# Patient Record
Sex: Female | Born: 1990 | Hispanic: Yes | Marital: Single | Smoking: Never smoker
Health system: Southern US, Community
[De-identification: ages and names within clinical notes are randomized; demographics above are authoritative.]

## PROBLEM LIST (undated history)

## (undated) ENCOUNTER — Inpatient Hospital Stay: Payer: Self-pay

## (undated) DIAGNOSIS — Z789 Other specified health status: Secondary | ICD-10-CM

## (undated) DIAGNOSIS — G43909 Migraine, unspecified, not intractable, without status migrainosus: Secondary | ICD-10-CM

## (undated) HISTORY — DX: Migraine, unspecified, not intractable, without status migrainosus: G43.909

---
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
## 2011-08-02 ENCOUNTER — Emergency Department: Payer: Self-pay | Admitting: Emergency Medicine

## 2011-08-02 LAB — COMPREHENSIVE METABOLIC PANEL
Albumin: 4.3 g/dL (ref 3.4–5.0)
Alkaline Phosphatase: 79 U/L (ref 50–136)
Anion Gap: 11 (ref 7–16)
BUN: 11 mg/dL (ref 7–18)
Bilirubin,Total: 0.6 mg/dL (ref 0.2–1.0)
Calcium, Total: 8.7 mg/dL (ref 8.5–10.1)
Chloride: 106 mmol/L (ref 98–107)
Co2: 23 mmol/L (ref 21–32)
Creatinine: 0.58 mg/dL — ABNORMAL LOW (ref 0.60–1.30)
EGFR (African American): >60
EGFR (Non-African Amer.): >60
Glucose: 92 mg/dL (ref 65–99)
Osmolality: 278 (ref 275–301)
Potassium: 3.8 mmol/L (ref 3.5–5.1)
SGOT(AST): 17 U/L (ref 15–37)
SGPT (ALT): 20 U/L
Sodium: 140 mmol/L (ref 136–145)
Total Protein: 8.2 g/dL (ref 6.4–8.2)

## 2011-08-02 LAB — CBC
HCT: 39 % (ref 35.0–47.0)
HGB: 12.8 g/dL (ref 12.0–16.0)
MCH: 30.6 pg (ref 26.0–34.0)
MCHC: 32.9 g/dL (ref 32.0–36.0)
MCV: 93 fL (ref 80–100)
Platelet: 266 10*3/uL (ref 150–440)
RBC: 4.2 10*6/uL (ref 3.80–5.20)
RDW: 13.2 % (ref 11.5–14.5)
WBC: 8.6 10*3/uL (ref 3.6–11.0)

## 2011-08-02 LAB — HCG, QUANTITATIVE, PREGNANCY: Beta Hcg, Quant.: 40 m[IU]/mL — ABNORMAL HIGH

## 2011-08-03 LAB — URINALYSIS, COMPLETE
Bacteria: NONE SEEN
Bilirubin,UR: NEGATIVE
Glucose,UR: NEGATIVE mg/dL (ref 0–75)
Ketone: NEGATIVE
Leukocyte Esterase: NEGATIVE
Nitrite: NEGATIVE
Ph: 7 (ref 4.5–8.0)
Protein: NEGATIVE
RBC,UR: <1 /HPF (ref 0–5)
Specific Gravity: 1 (ref 1.003–1.030)
Squamous Epithelial: 1
WBC UR: <1 /HPF (ref 0–5)

## 2011-08-03 LAB — WET PREP, GENITAL

## 2012-11-09 ENCOUNTER — Ambulatory Visit: Payer: Self-pay | Admitting: Advanced Practice Midwife

## 2013-02-17 ENCOUNTER — Inpatient Hospital Stay: Payer: Self-pay | Admitting: Obstetrics and Gynecology

## 2013-02-17 LAB — CBC WITH DIFFERENTIAL/PLATELET
Basophil #: 0.1 10*3/uL (ref 0.0–0.1)
Basophil %: 0.4 %
Eosinophil #: 0 10*3/uL (ref 0.0–0.7)
Eosinophil %: 0 %
HCT: 38.4 % (ref 35.0–47.0)
HGB: 12.6 g/dL (ref 12.0–16.0)
Lymphocyte #: 1.6 10*3/uL (ref 1.0–3.6)
Lymphocyte %: 12.3 %
MCH: 28.6 pg (ref 26.0–34.0)
MCHC: 32.7 g/dL (ref 32.0–36.0)
MCV: 88 fL (ref 80–100)
Monocyte #: 0.6 x10 3/mm (ref 0.2–0.9)
Monocyte %: 4.5 %
Neutrophil #: 10.6 10*3/uL — ABNORMAL HIGH (ref 1.4–6.5)
Neutrophil %: 82.8 %
Platelet: 219 10*3/uL (ref 150–440)
RBC: 4.38 10*6/uL (ref 3.80–5.20)
RDW: 13.9 % (ref 11.5–14.5)
WBC: 12.8 10*3/uL — ABNORMAL HIGH (ref 3.6–11.0)

## 2013-02-18 LAB — GC/CHLAMYDIA PROBE AMP

## 2013-02-18 LAB — HEMOGLOBIN: HGB: 11.7 g/dL — ABNORMAL LOW (ref 12.0–16.0)

## 2014-02-28 NOTE — L&D Delivery Note (Signed)
Delivery Note At 1:38 PM a viable female  was delivered  At 1338 via Vaginal, Spontaneous Delivery (Presentation: LOA ;  ).  APGAR:8/9  , ; weight  .8/10 #   Placenta status: , .  Cord:3 v  with the following complications:Mild shoulder dystocia . Mcroberts and suprapubic pressure allowed for delivery of anterior shoulder . Estimated time on the perineum 15 seconds . Anesthesia:  None   Episiotomy:  None  Lacerations: none   Suture Repair: none  Est. Blood Loss (mL):250 cc    Mom to postpartum.  Baby to Couplet care / Skin to Skin.  SCHERMERHORN,THOMAS 02/12/2015, 1:47 PM

## 2014-07-08 NOTE — H&P (Signed)
L&D Evaluation:  History:  HPI P1 at 38 weeks   Presents with contractions   Patient's Medical History No Chronic Illness  Hypertension   Patient's Surgical History none   Medications Pre Natal Vitamins   Allergies NKDA   Social History none   Family History Non-Contributory   ROS:  ROS All systems were reviewed.  HEENT, CNS, GI, GU, Respiratory, CV, Renal and Musculoskeletal systems were found to be normal.   Exam:  Vital Signs stable   Mental Status clear   FHT normal rate with no decels   Ucx regular   Ucx Frequency 3 min   Impression:  Impression active labor   Plan:  Comments antic svd   Electronic Signatures: Margaretha GlassingEvans, Ricky L (MD)  (Signed 21-Dec-14 17:08)  Authored: L&D Evaluation   Last Updated: 21-Dec-14 17:08 by Margaretha GlassingEvans, Ricky L (MD)

## 2014-08-13 ENCOUNTER — Other Ambulatory Visit: Payer: Self-pay | Admitting: Advanced Practice Midwife

## 2014-08-13 DIAGNOSIS — O3680X0 Pregnancy with inconclusive fetal viability, not applicable or unspecified: Secondary | ICD-10-CM

## 2014-08-13 LAB — OB RESULTS CONSOLE HIV ANTIBODY (ROUTINE TESTING): HIV: NONREACTIVE

## 2014-08-14 ENCOUNTER — Other Ambulatory Visit: Payer: Self-pay | Admitting: Advanced Practice Midwife

## 2014-08-14 ENCOUNTER — Ambulatory Visit
Admission: RE | Admit: 2014-08-14 | Discharge: 2014-08-14 | Disposition: A | Discharge status: Home / Self Care | Payer: Medicaid Other | Source: Ambulatory Visit | Attending: Advanced Practice Midwife | Admitting: Advanced Practice Midwife

## 2014-08-14 DIAGNOSIS — Z36 Encounter for antenatal screening of mother: Secondary | ICD-10-CM | POA: Insufficient documentation

## 2014-08-14 DIAGNOSIS — O3680X0 Pregnancy with inconclusive fetal viability, not applicable or unspecified: Secondary | ICD-10-CM

## 2014-08-14 DIAGNOSIS — Z3A13 13 weeks gestation of pregnancy: Secondary | ICD-10-CM | POA: Insufficient documentation

## 2014-08-14 LAB — OB RESULTS CONSOLE ABO/RH: RH Type: POSITIVE

## 2014-08-14 LAB — OB RESULTS CONSOLE HGB/HCT, BLOOD
HCT: 39 %
Hemoglobin: 14.8 g/dL

## 2014-08-14 LAB — OB RESULTS CONSOLE GC/CHLAMYDIA
Chlamydia: NEGATIVE
Gonorrhea: NEGATIVE

## 2014-08-14 LAB — OB RESULTS CONSOLE HEPATITIS B SURFACE ANTIGEN: Hepatitis B Surface Ag: NEGATIVE

## 2014-09-10 ENCOUNTER — Other Ambulatory Visit: Payer: Self-pay

## 2014-09-10 ENCOUNTER — Other Ambulatory Visit: Payer: Self-pay | Admitting: Advanced Practice Midwife

## 2014-09-10 DIAGNOSIS — Z3492 Encounter for supervision of normal pregnancy, unspecified, second trimester: Secondary | ICD-10-CM

## 2014-09-26 ENCOUNTER — Ambulatory Visit
Admission: RE | Admit: 2014-09-26 | Discharge: 2014-09-26 | Disposition: A | Discharge status: Home / Self Care | Payer: Medicaid Other | Source: Ambulatory Visit | Attending: Advanced Practice Midwife | Admitting: Advanced Practice Midwife

## 2014-09-26 DIAGNOSIS — Z3482 Encounter for supervision of other normal pregnancy, second trimester: Secondary | ICD-10-CM | POA: Diagnosis not present

## 2014-09-26 DIAGNOSIS — Z3492 Encounter for supervision of normal pregnancy, unspecified, second trimester: Secondary | ICD-10-CM

## 2014-10-08 ENCOUNTER — Other Ambulatory Visit: Payer: Self-pay | Admitting: Advanced Practice Midwife

## 2014-10-08 DIAGNOSIS — Z3482 Encounter for supervision of other normal pregnancy, second trimester: Secondary | ICD-10-CM

## 2014-10-15 ENCOUNTER — Ambulatory Visit
Admission: RE | Admit: 2014-10-15 | Discharge: 2014-10-15 | Disposition: A | Discharge status: Home / Self Care | Payer: Managed Care, Other (non HMO) | Source: Ambulatory Visit | Attending: Advanced Practice Midwife | Admitting: Advanced Practice Midwife

## 2014-10-15 DIAGNOSIS — Z3A22 22 weeks gestation of pregnancy: Secondary | ICD-10-CM | POA: Insufficient documentation

## 2014-10-15 DIAGNOSIS — Z3482 Encounter for supervision of other normal pregnancy, second trimester: Secondary | ICD-10-CM | POA: Insufficient documentation

## 2014-11-23 ENCOUNTER — Observation Stay
Admission: EM | Admit: 2014-11-23 | Discharge: 2014-11-23 | Disposition: A | Discharge status: Home / Self Care | Payer: Managed Care, Other (non HMO) | Source: Home / Self Care | Admitting: Obstetrics and Gynecology

## 2014-11-23 ENCOUNTER — Encounter: Payer: Self-pay | Admitting: *Deleted

## 2014-11-23 DIAGNOSIS — Z3A27 27 weeks gestation of pregnancy: Secondary | ICD-10-CM | POA: Insufficient documentation

## 2014-11-23 DIAGNOSIS — B9689 Other specified bacterial agents as the cause of diseases classified elsewhere: Secondary | ICD-10-CM | POA: Diagnosis present

## 2014-11-23 DIAGNOSIS — R0981 Nasal congestion: Secondary | ICD-10-CM | POA: Diagnosis present

## 2014-11-23 DIAGNOSIS — R1011 Right upper quadrant pain: Secondary | ICD-10-CM

## 2014-11-23 DIAGNOSIS — J069 Acute upper respiratory infection, unspecified: Secondary | ICD-10-CM | POA: Diagnosis present

## 2014-11-23 DIAGNOSIS — N760 Acute vaginitis: Secondary | ICD-10-CM

## 2014-11-23 DIAGNOSIS — O26899 Other specified pregnancy related conditions, unspecified trimester: Secondary | ICD-10-CM

## 2014-11-23 DIAGNOSIS — O26892 Other specified pregnancy related conditions, second trimester: Secondary | ICD-10-CM | POA: Insufficient documentation

## 2014-11-23 HISTORY — DX: Other specified health status: Z78.9

## 2014-11-23 LAB — URINALYSIS COMPLETE WITH MICROSCOPIC (ARMC ONLY)
Bilirubin Urine: NEGATIVE
Glucose, UA: NEGATIVE mg/dL
Leukocytes, UA: NEGATIVE
Nitrite: NEGATIVE
Protein, ur: NEGATIVE mg/dL
Specific Gravity, Urine: 1.014 (ref 1.005–1.030)
pH: 6 (ref 5.0–8.0)

## 2014-11-23 LAB — CBC WITH DIFFERENTIAL/PLATELET
Basophils Absolute: 0 10*3/uL (ref 0–0.1)
Basophils Relative: 0 %
Eosinophils Absolute: 0 10*3/uL (ref 0–0.7)
Eosinophils Relative: 0 %
HCT: 37.7 % (ref 35.0–47.0)
Hemoglobin: 12.6 g/dL (ref 12.0–16.0)
Lymphocytes Relative: 10 %
Lymphs Abs: 1 10*3/uL (ref 1.0–3.6)
MCH: 30.1 pg (ref 26.0–34.0)
MCHC: 33.4 g/dL (ref 32.0–36.0)
MCV: 90.1 fL (ref 80.0–100.0)
Monocytes Absolute: 1 10*3/uL — ABNORMAL HIGH (ref 0.2–0.9)
Monocytes Relative: 10 %
Neutro Abs: 8.1 10*3/uL — ABNORMAL HIGH (ref 1.4–6.5)
Neutrophils Relative %: 80 %
Platelets: 191 10*3/uL (ref 150–440)
RBC: 4.18 MIL/uL (ref 3.80–5.20)
RDW: 13.6 % (ref 11.5–14.5)
WBC: 10.2 10*3/uL (ref 3.6–11.0)

## 2014-11-23 LAB — COMPREHENSIVE METABOLIC PANEL
ALT: 19 U/L (ref 14–54)
AST: 21 U/L (ref 15–41)
Albumin: 2.9 g/dL — ABNORMAL LOW (ref 3.5–5.0)
Alkaline Phosphatase: 81 U/L (ref 38–126)
Anion gap: 7 (ref 5–15)
BUN: 8 mg/dL (ref 6–20)
CO2: 23 mmol/L (ref 22–32)
Calcium: 8.3 mg/dL — ABNORMAL LOW (ref 8.9–10.3)
Chloride: 102 mmol/L (ref 101–111)
Creatinine, Ser: 0.55 mg/dL (ref 0.44–1.00)
GFR calc Af Amer: >60 mL/min (ref >60)
GFR calc non Af Amer: >60 mL/min (ref >60)
Glucose, Bld: 91 mg/dL (ref 65–99)
Potassium: 3.3 mmol/L — ABNORMAL LOW (ref 3.5–5.1)
Sodium: 132 mmol/L — ABNORMAL LOW (ref 135–145)
Total Bilirubin: 1 mg/dL (ref 0.3–1.2)
Total Protein: 6.7 g/dL (ref 6.5–8.1)

## 2014-11-23 LAB — RAPID INFLUENZA A&B ANTIGENS
Influenza A (ARMC): NEGATIVE
Influenza B (ARMC): NEGATIVE

## 2014-11-23 LAB — ABO/RH: ABO/RH(D): O POS

## 2014-11-23 LAB — WET PREP, GENITAL
Trich, Wet Prep: NONE SEEN
Yeast Wet Prep HPF POC: NONE SEEN

## 2014-11-23 LAB — CHLAMYDIA/NGC RT PCR (ARMC ONLY)
Chlamydia Tr: NOT DETECTED
N gonorrhoeae: NOT DETECTED

## 2014-11-23 LAB — TYPE AND SCREEN
ABO/RH(D): O POS
Antibody Screen: NEGATIVE

## 2014-11-23 LAB — AMYLASE: Amylase: 37 U/L (ref 28–100)

## 2014-11-23 LAB — LIPASE, BLOOD: Lipase: 14 U/L — ABNORMAL LOW (ref 22–51)

## 2014-11-23 MED ORDER — GUAIFENESIN ER 600 MG PO TB12: 600.0000 mg | ORAL_TABLET | Freq: Two times a day (BID) | ORAL | Status: DC

## 2014-11-23 MED ORDER — AMOXICILLIN-POT CLAVULANATE 500-125 MG PO TABS: 1.0000 | ORAL_TABLET | Freq: Three times a day (TID) | ORAL | Status: DC

## 2014-11-23 MED ORDER — LACTATED RINGERS IV BOLUS (SEPSIS): 800.0000 mL | Freq: Once | INTRAVENOUS | Status: DC

## 2014-11-23 MED ORDER — BENZOCAINE-MENTHOL 15-3.6 MG MT LOZG
1.0000 | LOZENGE | OROMUCOSAL | Status: DC | PRN
  Filled 2014-11-23: qty 1

## 2014-11-23 MED ORDER — DEXTROSE-NACL 5-0.9 % IV SOLN
INTRAVENOUS | Status: DC
  Administered 2014-11-23: 100 mL/h via INTRAVENOUS

## 2014-11-23 MED ORDER — SALINE SPRAY 0.65 % NA SOLN
1.0000 | NASAL | Status: DC | PRN
  Filled 2014-11-23: qty 44

## 2014-11-23 MED ORDER — GUAIFENESIN ER 600 MG PO TB12
600.0000 mg | ORAL_TABLET | Freq: Two times a day (BID) | ORAL | Status: DC
  Administered 2014-11-23: 600 mg via ORAL

## 2014-11-23 MED ORDER — LACTATED RINGERS IV SOLN
INTRAVENOUS | Status: DC
  Administered 2014-11-23: 125 mL/h via INTRAVENOUS

## 2014-11-23 MED ORDER — DOCUSATE SODIUM 100 MG PO CAPS
100.0000 mg | ORAL_CAPSULE | Freq: Every day | ORAL | Status: DC
  Administered 2014-11-23: 100 mg via ORAL
  Filled 2014-11-23: qty 1

## 2014-11-23 MED ORDER — CALCIUM CARBONATE ANTACID 500 MG PO CHEW: 2.0000 | CHEWABLE_TABLET | ORAL | Status: DC | PRN

## 2014-11-23 MED ORDER — PRENATAL MULTIVITAMIN CH
1.0000 | ORAL_TABLET | Freq: Every day | ORAL | Status: DC
  Administered 2014-11-23: 1 via ORAL
  Filled 2014-11-23: qty 1

## 2014-11-23 MED ORDER — AMOXICILLIN-POT CLAVULANATE 500-125 MG PO TABS
1.0000 | ORAL_TABLET | Freq: Three times a day (TID) | ORAL | Status: DC
  Administered 2014-11-23 (×2): 500 mg via ORAL
  Filled 2014-11-23 (×3): qty 1

## 2014-11-23 MED ORDER — METRONIDAZOLE 500 MG PO TABS
500.0000 mg | ORAL_TABLET | Freq: Two times a day (BID) | ORAL | Status: DC
  Administered 2014-11-23: 500 mg via ORAL
  Filled 2014-11-23: qty 1

## 2014-11-23 MED ORDER — METRONIDAZOLE 500 MG PO TABS: 500.0000 mg | ORAL_TABLET | Freq: Two times a day (BID) | ORAL | Status: DC

## 2014-11-23 MED ORDER — MENTHOL 3 MG MT LOZG
1.0000 | LOZENGE | OROMUCOSAL | Status: DC | PRN
  Filled 2014-11-23: qty 9

## 2014-11-23 MED ORDER — ACETAMINOPHEN 325 MG PO TABS: 650.0000 mg | ORAL_TABLET | ORAL | Status: DC | PRN

## 2014-11-23 MED ORDER — ACETAMINOPHEN 325 MG PO TABS
650.0000 mg | ORAL_TABLET | ORAL | Status: DC | PRN
  Administered 2014-11-23 (×3): 650 mg via ORAL
  Filled 2014-11-23 (×2): qty 2

## 2014-11-23 MED ORDER — ZOLPIDEM TARTRATE 5 MG PO TABS: 5.0000 mg | ORAL_TABLET | Freq: Every evening | ORAL | Status: DC | PRN

## 2014-11-23 MED ORDER — SALINE SPRAY 0.65 % NA SOLN
1.0000 | NASAL | Status: DC | PRN
  Filled 2014-11-23: qty 45

## 2014-11-23 NOTE — OB Triage Note (Addendum)
Pt reports symptoms listed above. Resting quietly at present, adjusting position prn, does not appear in distress from pain at present. Coughing intermittently, no production viewed. Lights dimmed per pt preference. Fetal movement heard audible, pt unsure she feels any fetal movement.

## 2014-11-23 NOTE — Discharge Instructions (Signed)
Infeccin de las vas areas superiores en los adultos (Upper Respiratory Infection, Adult)  La infeccin respiratoria de las vas areas superiores se conoce tambin como resfro comn. Las vas areas superiores incluyen los senos nasales, la garganta, la trquea, y los bronquios. Los bronquios son las vas areas que conducen el aire a los pulmonBaxter Internationalyor parte de las personas mejora luego de una [place], Biomedical engineer los sntomas pueden durar [employer]. La tos residual puede durar ms. CAUSAS Varios tipos de virus pueden causar la infeccin de los tejidos que cubren las vas areas superiores. Los tejidos se irritan y se inflaman y se originan secreciones. Tambin es frecuente la produccin de moco. El resfro es contagioso. El virus se disemina fcilmente a otras personas por contacto oral. Aqu se incluyen los besos, el compartir un vaso y el toser o Engineering geologist. Tambin puede diseminarse tocndose la boca o la [place] y luego tocando una superficie que luego tocan Economist.  SNTOMAS Los sntomas se desarrollan entre uno y [place] luego de Cytogeneticist en contacto con el virus. Pueden variar de Neomia Dear persona a otra. Incluyen:  Secrecin nasal.  Estornudos  Congestin nasal.  Irritacin de los senos nasales.  Dolor de Advertising copywriter.  Prdida de la voz (laringitis).  Tos.  Fatiga.  Dolores musculares.  Prdida del apetito.  Dolor de [place].  Fiebre no muy elevada. DIAGNSTICO Puede diagnosticarse a s mismo la infeccin respiratoria, segn los sntomas habituales, ya que la mayor parte de las personas se resfra dos o tres veces al ao. El profesional puede confirmarlo basndose en el examen fsico. Lo ms importante es que el profesional verifique que los sntomas no se deben a otra enfermedad como anginas, sinusitis, neumona, asma o epiglotitis. Para diagnosticar el resfrio comn, no es necesario que haga anlisis de [place], pruebas en la garganta o radiografas, pero en algunos  casos puede ser de utilidad para excluir otros problemas ms graves. El mdico decidir si necesita otras pruebas. RIESGOS Y COMPLICACIONES Tendr mayor riesgo de sufrir un resfro grave si consume cigarrillos, sufre una enfermedad cardaca (como insuficiencia cardaca) o pulmonar crnica (como asma) o si tiene un debilitamiento del sistema inmunolgico. Las personas muy jvenes o muy mayores tienen riesgo de sufrir infecciones ms graves. La sinusitis bacteriana, las infecciones del odo medio y la neumona bacteriana pueden complicar el resfro comn. El resfro puede exacerbar el asma y la enfermedad pulmonar obstructiva crnica. En algunos casos estas complicaciones requieren la atencin en un servicio de emergencias y pueden poner en peligro la vida. PREVENCIN La mejor manera de protegerse para no contraer un resfro es Pharmacologist una buena higiene. Evite el contacto bucal o de las manos con personas con sntomas de resfro. Si se produce el contacto, lvese las manos con frecuencia. No hay pruebas firmes que indiquen que la vitamina C, la vitamina E, la equincea o la actividad fsica reduzcan las posibilidades de tener una infeccin. Sin embargo, siempre se recomienda Insurance account manager y Winferd Humphrey buena nutricin. TRATAMIENTO El tratamiento est dirigido a Consulting civil engineer sntomas. Esta enfermedad no tiene [place]. Los antibiticos no son eficaces, ya que esta infeccin la causa un virus y no una bacteria. El tratamiento incluye: 1. Aumente la ingesta de lquidos. Consumo de bebidas deportivas, que proporcionan electrolitos,azcares e hidratacin. 2. Inhale vapor caliente (de un vaporizador o de la ducha). 3. Tomar sopa de pollo u otros lquidos claros, y [employer] buena nutricin. 4. Descanse lo suficiente. 5. Haga grgaras o coma pastillas para Paramedic  las molestias. 6. Control de la fiebre con ibuprofeno o acetaminofen, segn las indicaciones del mdico. 7. Aumento del uso del inhalador, si sufre  asma. Las pastillas y los geles de zinc durante las primeras 24 horas de iniciado el resfro comn, pueden disminuir la duracin y Paramedic la gravedad de los sntomas. Los medicamentos para Chief Technology Officer pueden disminuir la fiebre, Paramedic los dolores musculares y Chief Technology Officer de Advertising copywriter. Se dispone de una gran variedad de medicamentos de venta libre para tratar la congestin y la secrecin nasal. El profesional podr recomendarle inhalantes para los otros sntomas. INSTRUCCIONES PARA EL CUIDADO DOMICILIARIO  Utilice los medicamentos de venta libre o de prescripcin para Chief Technology Officer, el malestar o la [employer]ent, segn se lo indique el profesional que lo asiste.  Utilice un vaporizador caliente o inhale vapor, haciendo salir agua de la ducha para aumentar la humedad [place]. Esto mantendr las secreciones hmedas y Community education officer ms fcil respirar.  Beba gran cantidad de lquido para mantener la orina de tono claro o color amarillo plido.  Descanse todo lo que pueda.  Regrese a su trabajo cuando la temperatura se haya normalizado, o cuando el profesional que lo asiste se lo indique. Quizs sea necesario que permanezca en su casa durante un tiempo ms prolongado para Buyer, retail a Economist. Tambin puede utilizar un barbijo y ser cuidadoso con el lavado de manos para evitar la diseminacin del virus. SOLICITE ATENCIN MDICA SI:  Luego de los primeros das siente que empeora en vez de [place].  Necesita que Occupational psychologist brinde ms informacin relacionada con los medicamentos para [employer] sntomas.  Siente escalofros, le falta el aire o escupe moco de color marrn o rojo. Estos pueden ser sntomas de neumona.  Tiene una secrecin nasal de color amarillo o marrn, o siente dolor en el rostro, especialmente cuando se inclina hacia adelante. Estos pueden ser sntomas de sinusitis.  Tiene fiebre, siente el cuello hinchado, tiene dolor al tragar u observa manchas blancas en el fondo de la  garganta. Estos pueden ser sntomas de angina por estreptococo. Romona Curls ATENCIN MDICA DE INMEDIATO SI:  Lance Muss.  Comienza a sentir Herbalist de cabeza intenso o persistente, dolor de odos, en el seno nasal o en el pecho.  Tiene tos y esta se prolonga demasiado, tose y escupe sangre, la mucosidad habitual se modifica (si tiene una enfermedad pulmonar crnica) o respira con dificultad.  Siente rigidez en el cuello o dolor de cabeza intenso. Document Released: 11/24/2004 Document Revised: 05/09/2011 [facility] Patient Information 2015 [place], [place]. This information is not intended to replace advice given to you by your health care provider. Make sure you discuss any questions you have with your health care provider.  Evaluacin de los movimientos fetales  (Fetal Movement Counts) Nombre del paciente: __________________________________________________ Micheline Chapman estimada: ____________________ Caroleen Hamman de los movimientos fetales es muy recomendable en los embarazos de alto riesgo, pero tambin es una buena idea que lo hagan todas las [place]. El Firefighter que comience a contarlos a las 28 semanas de [place]. Los movimientos fetales suelen aumentar:   Despus de Animator.  Despus de la actividad fsica.  Despus de comer o beber [employer] o fro.  En reposo. Preste atencin cuando sienta que el beb est ms activo. Esto le ayudar a notar un patrn de ciclos de vigilia y sueo de su beb y cules son los factores que contribuyen a un aumento de los movimientos fetales. Es importante  llevar a cabo un recuento de movimientos fetales, al mismo tiempo cada da, cuando el beb normalmente est ms activo.  CMO CONTAR LOS MOVIMIENTOS FETALES 8. Busque un lugar tranquilo y cmodo para sentarse o recostarse sobre el lado izquierdo. Al recostarse sobre su lado izquierdo, le proporciona una mejor circulacin de [place] y oxgeno al beb. 9. Anote el da y la  hora en una hoja de papel o en un diario. 10. Comience contando las pataditas, revoloteos, chasquidos, vueltas o pinchazos en un perodo de 2 horas. Debe sentir al menos 10 movimientos en 2 horas. 11. Si no siente 10 movimientos en 2 horas, espere 2  3 horas y cuente de nuevo. Busque cambios en el patrn o si no cuenta lo suficiente en 2 horas. SOLICITE ATENCIN MDICA SI:   Siente menos de 10 pataditas en 2 horas, en dos intentos.  No hay movimientos durante una hora.  El patrn se modifica o le lleva ms tiempo Art gallery manager las 10 pataditas.  Siente que el beb no se mueve como lo hace habitualmente. Fecha: ____________ Movimientos: ____________ Stevan Born inicio: ____________ Stevan Born finalizacin: ____________  Franco Nones: ____________ Movimientos: ____________ Stevan Born inicio: ____________ Stevan Born finalizacin: ____________  Franco Nones: ____________ Movimientos: ____________ Stevan Born inicio: ____________ Stevan Born finalizacin: ____________  Franco Nones: ____________ Movimientos: ____________ Stevan Born inicio: ____________ Stevan Born finalizacin: ____________  Franco Nones: ____________ Movimientos: ____________ Stevan Born inicio: ____________ Mammie Russian de finalizacin: ____________  Franco Nones: ____________ Movimientos: ____________ Mammie Russian de inicio: ____________ Mammie Russian de finalizacin: ____________  Franco Nones: ____________ Movimientos: ____________ Mammie Russian de inicio: ____________ Mammie Russian de finalizacin: ____________  Franco Nones: ____________ Movimientos: ____________ Mammie Russian de inicio: ____________ Mammie Russian de finalizacin: ____________  Franco Nones: ____________ Movimientos: ____________ Mammie Russian de inicio: ____________ Mammie Russian de finalizacin: ____________  Franco Nones: ____________ Movimientos: ____________ Mammie Russian de inicio: ____________ Mammie Russian de finalizacin: ____________  Franco Nones: ____________ Movimientos: ____________ Mammie Russian de inicio: ____________ Mammie Russian de finalizacin: ____________  Franco Nones: ____________ Movimientos: ____________ Mammie Russian de inicio: ____________ Mammie Russian de  finalizacin: ____________  Franco Nones: ____________ Movimientos: ____________ Mammie Russian de inicio: ____________ Mammie Russian de finalizacin: ____________  Franco Nones: ____________ Movimientos: ____________ Mammie Russian de inicio: ____________ Mammie Russian de finalizacin: ____________  Franco Nones: ____________ Movimientos: ____________ Mammie Russian de inicio: ____________ Mammie Russian de finalizacin: ____________  Franco Nones: ____________ Movimientos: ____________ Mammie Russian de inicio: ____________ Mammie Russian de finalizacin: ____________  Franco Nones: ____________ Movimientos: ____________ Mammie Russian de inicio: ____________ Mammie Russian de finalizacin: ____________  Franco Nones: ____________ Movimientos: ____________ Mammie Russian de inicio: ____________ Mammie Russian de finalizacin: ____________  Franco Nones: ____________ Movimientos: ____________ Mammie Russian de inicio: ____________ Mammie Russian de finalizacin: ____________  Franco Nones: ____________ Movimientos: ____________ Mammie Russian de inicio: ____________ Mammie Russian de finalizacin: ____________  Franco Nones: ____________ Movimientos: ____________ Mammie Russian de inicio: ____________ Mammie Russian de finalizacin: ____________  Franco Nones: ____________ Movimientos: ____________ Mammie Russian de inicio: ____________ Mammie Russian de finalizacin: ____________  Franco Nones: ____________ Movimientos: ____________ Mammie Russian de inicio: ____________ Mammie Russian de finalizacin: ____________  Franco Nones: ____________ Movimientos: ____________ Mammie Russian de inicio: ____________ Mammie Russian de finalizacin: ____________  Franco Nones: ____________ Movimientos: ____________ Mammie Russian de inicio: ____________ Mammie Russian de finalizacin: ____________  Franco Nones: ____________ Movimientos: ____________ Mammie Russian de inicio: ____________ Mammie Russian de finalizacin: ____________  Franco Nones: ____________ Movimientos: ____________ Mammie Russian de inicio: ____________ Mammie Russian de finalizacin: ____________  Franco Nones: ____________ Movimientos: ____________ Mammie Russian de inicio: ____________ Mammie Russian de finalizacin: ____________  Franco Nones: ____________ Movimientos: ____________ Mammie Russian de inicio: ____________ Mammie Russian de finalizacin: ____________  Franco Nones:  ____________ Movimientos: ____________ Mammie Russian de inicio: ____________ Mammie Russian de finalizacin: ____________  Franco Nones: ____________ Movimientos: ____________ Mammie Russian de inicio: ____________ Mammie Russian de finalizacin: ____________  Franco Nones: ____________ Movimientos: ____________ Mammie Russian de inicio: ____________ Stevan Born finalizacin:  ____________  Franco Nones: ____________ Movimientos: ____________ Stevan Born inicio: ____________ Stevan Born finalizacin: ____________  Franco Nones: ____________ Movimientos: ____________ Stevan Born inicio: ____________ Stevan Born finalizacin: ____________  Franco Nones: ____________ Movimientos: ____________ Stevan Born inicio: ____________ Stevan Born finalizacin: ____________  Franco Nones: ____________ Movimientos: ____________ Stevan Born inicio: ____________ Stevan Born finalizacin: ____________  Franco Nones: ____________ Movimientos: ____________ Stevan Born inicio: ____________ Mammie Russian de finalizacin: ____________  Franco Nones: ____________ Movimientos: ____________ Mammie Russian de inicio: ____________ Mammie Russian de finalizacin: ____________  Franco Nones: ____________ Movimientos: ____________ Mammie Russian de inicio: ____________ Mammie Russian de finalizacin: ____________  Franco Nones: ____________ Movimientos: ____________ Mammie Russian de inicio: ____________ Mammie Russian de finalizacin: ____________  Franco Nones: ____________ Movimientos: ____________ Mammie Russian de inicio: ____________ Mammie Russian de finalizacin: ____________  Franco Nones: ____________ Movimientos: ____________ Mammie Russian de inicio: ____________ Mammie Russian de finalizacin: ____________  Franco Nones: ____________ Movimientos: ____________ Mammie Russian de inicio: ____________ Mammie Russian de finalizacin: ____________  Franco Nones: ____________ Movimientos: ____________ Mammie Russian de inicio: ____________ Mammie Russian de finalizacin: ____________  Franco Nones: ____________ Movimientos: ____________ Mammie Russian de inicio: ____________ Mammie Russian de finalizacin: ____________  Franco Nones: ____________ Movimientos: ____________ Mammie Russian de inicio: ____________ Mammie Russian de finalizacin: ____________  Franco Nones: ____________ Movimientos: ____________ Mammie Russian  de inicio: ____________ Mammie Russian de finalizacin: ____________  Franco Nones: ____________ Movimientos: ____________ Mammie Russian de inicio: ____________ Mammie Russian de finalizacin: ____________  Franco Nones: ____________ Movimientos: ____________ Mammie Russian de inicio: ____________ Mammie Russian de finalizacin: ____________  Franco Nones: ____________ Movimientos: ____________ Mammie Russian de inicio: ____________ Mammie Russian de finalizacin: ____________  Franco Nones: ____________ Movimientos: ____________ Mammie Russian de inicio: ____________ Mammie Russian de finalizacin: ____________  Franco Nones: ____________ Movimientos: ____________ Mammie Russian de inicio: ____________ Mammie Russian de finalizacin: ____________  Franco Nones: ____________ Movimientos: ____________ Mammie Russian de inicio: ____________ Mammie Russian de finalizacin: ____________  Franco Nones: ____________ Movimientos: ____________ Mammie Russian de inicio: ____________ Stevan Born finalizacin: ____________  Franco Nones: ____________ Movimientos: ____________ Mammie Russian de inicio: ____________ Mammie Russian de finalizacin: ____________  Franco Nones: ____________ Movimientos: ____________ Stevan Born inicio: ____________ Mammie Russian de finalizacin: ____________  Document Released: 05/24/2007 Document Revised: 02/01/2012 [employer] Patient Information 2015 [place], [place]. This information is not intended to replace advice given to you by your health care provider. Make sure you discuss any questions you have with your health care provider.

## 2014-11-23 NOTE — Progress Notes (Signed)
CNM Sigmon called to floor to notify RN of new orders. CNM given report- states vaginal swabs may wait until pt awake later.

## 2014-11-23 NOTE — Progress Notes (Signed)
Addendum to physical exam: no CVAT   Karena Addison, CNM

## 2014-11-23 NOTE — Progress Notes (Addendum)
ANTEPARTUM NOTE - Hospital Day #1  Subjective: Reports feeling better than yesterday - was able to sleep some overnight Tolerating po intake / no nausea / no vomiting  Voiding QS with no problems or dysuria  Report no vaginal bleeding + Good fetal movement      Still c/o persistent productive cough and intermittent fever Back pain and RUQ pain improving  +white, thick vaginal discharge with odor  Objective: Vital signs: VS: Blood pressure 120/65, pulse 107, temperature 100.1 F (37.8 C), temperature source Oral, resp. rate 20, height 5' (1.524 m), weight 86.183 kg (190 lb), SpO2 97 %.  Labs: Recent Results (from the past 2160 hour(s))  Type and screen     Status: None   Collection Time: 11/23/14  2:55 AM  Result Value Ref Range   ABO/RH(D) O POS    Antibody Screen NEG    Sample Expiration 11/26/2014   Comprehensive metabolic panel     Status: Abnormal   Collection Time: 11/23/14  2:55 AM  Result Value Ref Range   Sodium 132 (L) 135 - 145 mmol/L   Potassium 3.3 (L) 3.5 - 5.1 mmol/L   Chloride 102 101 - 111 mmol/L   CO2 23 22 - 32 mmol/L   Glucose, Bld 91 65 - 99 mg/dL   BUN 8 6 - 20 mg/dL   Creatinine, Ser 0.55 0.44 - 1.00 mg/dL   Calcium 8.3 (L) 8.9 - 10.3 mg/dL   Total Protein 6.7 6.5 - 8.1 g/dL   Albumin 2.9 (L) 3.5 - 5.0 g/dL   AST 21 15 - 41 U/L   ALT 19 14 - 54 U/L   Alkaline Phosphatase 81 38 - 126 U/L   Total Bilirubin 1.0 0.3 - 1.2 mg/dL   GFR calc non Af Amer >60 >60 mL/min   GFR calc Af Amer >60 >60 mL/min    Comment: (NOTE) The eGFR has been calculated using the CKD EPI equation. This calculation has not been validated in all clinical situations. eGFR's persistently <60 mL/min signify possible Chronic Kidney Disease.    Anion gap 7 5 - 15  Lipase, blood     Status: Abnormal   Collection Time: 11/23/14  2:55 AM  Result Value Ref Range   Lipase 14 (L) 22 - 51 U/L  Amylase     Status: None   Collection Time: 11/23/14  2:55 AM  Result Value Ref Range   Amylase 37 28 - 100 U/L  CBC with Differential/Platelet     Status: Abnormal   Collection Time: 11/23/14  2:55 AM  Result Value Ref Range   WBC 10.2 3.6 - 11.0 K/uL   RBC 4.18 3.80 - 5.20 MIL/uL   Hemoglobin 12.6 12.0 - 16.0 g/dL   HCT 37.7 35.0 - 47.0 %   MCV 90.1 80.0 - 100.0 fL   MCH 30.1 26.0 - 34.0 pg   MCHC 33.4 32.0 - 36.0 g/dL   RDW 13.6 11.5 - 14.5 %   Platelets 191 150 - 440 K/uL   Neutrophils Relative % 80 %   Neutro Abs 8.1 (H) 1.4 - 6.5 K/uL   Lymphocytes Relative 10 %   Lymphs Abs 1.0 1.0 - 3.6 K/uL   Monocytes Relative 10 %   Monocytes Absolute 1.0 (H) 0.2 - 0.9 K/uL   Eosinophils Relative 0 %   Eosinophils Absolute 0.0 0 - 0.7 K/uL   Basophils Relative 0 %   Basophils Absolute 0.0 0 - 0.1 K/uL  Urinalysis complete, with microscopic [facility]  only)     Status: Abnormal   Collection Time: 11/23/14  2:55 AM  Result Value Ref Range   Color, Urine YELLOW (A) YELLOW   APPearance CLEAR (A) CLEAR   Glucose, UA NEGATIVE NEGATIVE mg/dL   Bilirubin Urine NEGATIVE NEGATIVE   Ketones, ur 1+ (A) NEGATIVE mg/dL   Specific Gravity, Urine 1.014 1.005 - 1.030   Hgb urine dipstick 1+ (A) NEGATIVE   pH 6.0 5.0 - 8.0   Protein, ur NEGATIVE NEGATIVE mg/dL   Nitrite NEGATIVE NEGATIVE   Leukocytes, UA NEGATIVE NEGATIVE   RBC / HPF 0-5 0 - 5 RBC/hpf   WBC, UA 0-5 0 - 5 WBC/hpf   Bacteria, UA RARE (A) NONE SEEN   Squamous Epithelial / LPF 6-30 (A) NONE SEEN   Mucous PRESENT   ABO/Rh     Status: None   Collection Time: 11/23/14  2:55 AM  Result Value Ref Range   ABO/RH(D) O POS   Influenza A&B Antigens ([facility] only)     Status: None   Collection Time: 11/23/14  7:30 AM  Result Value Ref Range   Influenza A ([facility]) FLU A NEG CONTROL    Influenza B ([facility]) FLU B NEG CONTROL    Physical exam:        General appearance/behavior: alert, calm, NAD       Heart: RRR, S1, S2       Lungs: clear, equal bilaterally       Abdomen: gravid, soft, non-tender       Extremities: no edema, no  calf pain or tenderness        External genitalia: no erythema, no condyloma, no lesions, no discharge       Speculum examination: +moderate thick, white discharge / cervix appears closed / no lesions / no bleeding / no pooling        Bimanual exam / VE: deferred        Fetal Assessment:  FHR: Baseline: 150bmp / Moderate Variability / +15x15 accels / no decels TOCO: occasional contractions with occasional UI  Results for Lorely, [facility] (MRN [id number]) as of 11/23/2014 12:39  Ref. Range 11/23/2014 11:30  Yeast Wet Prep HPF POC Latest Ref Range: NONE SEEN  NONE SEEN  Trich, Wet Prep Latest Ref Range: NONE SEEN  NONE SEEN  Clue Cells Wet Prep HPF POC Latest Ref Range: NONE SEEN  FEW (A)  WBC, Wet Prep HPF POC Latest Ref Range: NONE SEEN  RARE (A)     Assessment: 27+ [redacted] weeks gestation FHR category 1 Upper respiratory infection affecting pregnancy Vaginitis - Bacterial Vaginosis    Plan:  Wet prep and GC/Cl today Begin Augmentin 572m TID x 5 days Mucinex BID Flagyl 5055mBID x 7 days for BV Saline Nasal Spray PRN Cepacol throat lozenges  Continue IVF and Tylenol PRN NST Q shift  Consider discharge this afternoon   Dr BeLeafy Ropdated with patient status / plan of care  MeDarliss CheneyCNM

## 2014-11-23 NOTE — H&P (Signed)
Kimberly Mccoy is a 24 y.o. female G3P2 presenting for c/o RUQ abdominal pain x 1 week, decreased fetal movement, fever, and cold symptoms. Reports ongoing right-mid back pain throughout entire pregnancy while lying on her back, but improvement with repositioning.  RUQ tenderness/ pain x 1 week, but denies nausea and vomiting, or pain after meals.  Pt. Reports she did vomit x 2 on Saturday.  Pt. Reports cold/congestion symptoms starting on Monday 9/19, and has progressively gotten worse and moved into her chest.  She reports coughing up dark yellow sputum.  She also reports sinus congestion without pain and dull headache.  Also reports husband had similar cold and congestion symptoms x 1 week and believes he was treated with Amoxicillin and TheraFlu.  Pt. Has not had anything to eat since Friday, has only been drinking liquids and then noticed decreased fetal movement.  Pt. Is currently seeking prenatal care at [facility].  Maternal Medical History:  Reason for admission: Nausea.   Interpreter at bedside during evaluation  OB History    Gravida Para Term Preterm AB TAB SAB Ectopic Multiple Living   0 0 0 0 0 1 4     Past Medical History  Diagnosis Date  . Medical history non-contributory    History reviewed. No pertinent past surgical history. Family History: family history is not on file. Social History:  reports that she has never smoked. She has never used smokeless tobacco. She reports that she does not drink alcohol or use illicit drugs.  Review of Systems  Constitutional: Positive for fever, chills and malaise/fatigue.  HENT: Positive for congestion.   Eyes: Negative for blurred vision and double vision.  Respiratory: Positive for cough. Negative for hemoptysis, shortness of breath and wheezing.        Yellow sputum  Cardiovascular: Negative for chest pain, palpitations and leg swelling.  Gastrointestinal: Positive for abdominal pain. Negative for  heartburn, nausea, diarrhea and constipation.       Vomited x2 on Friday   RUQ dull pain x 1 week  Genitourinary: Negative for dysuria, urgency and frequency.  Musculoskeletal: Positive for back pain. Negative for neck pain.  Skin: Negative for itching and rash.  Neurological: Positive for weakness and headaches. Negative for dizziness and seizures.  Endo/Heme/Allergies: Does not bruise/bleed easily.  Psychiatric/Behavioral: Negative for depression. The patient is not nervous/anxious.   Denies nausea and vomiting and pain after meals  +increase in vaginal discharge - reports white in color with a foul odor     Exam Physical Exam  Nursing note and vitals reviewed. Constitutional: She is oriented to person, place, and time. She appears well-developed and well-nourished.  HENT:  Head: Normocephalic.  Eyes: Pupils are equal, round, and reactive to light.  Neck: Normal range of motion.  Cardiovascular: Normal rate, regular rhythm and normal heart sounds.  Exam reveals no gallop and no friction rub.   No murmur heard. Respiratory: Effort normal and breath sounds normal. No respiratory distress. She has no wheezes. She has no rales. She exhibits no tenderness.  Productive Cough  GI: Soft. Bowel sounds are normal. She exhibits no distension. There is tenderness. There is no rebound and no guarding.  RUQ slight tenderness on palpation   Genitourinary: Uterus normal.  Gravid uterus  Musculoskeletal: Normal range of motion. She exhibits tenderness.  +musculoskeletal back pain   Neurological: She is alert and oriented to person, place, and time. She has normal reflexes.  Skin: Skin is warm.  She is diaphoretic.  Psychiatric: She has a normal mood and affect. Her behavior is normal.  Sinuses not tender on palpation    Fetal Assessment:  Starting baseline on admission: 175-180bmp / moderate variability / + 10x10  accels / no decels   Current baseline: 150bmp/moderate variability / +10x10  accels / no decels TOCO: occasional contraction with UI Results for Kimberly Mccoy, Kimberly Mccoy [facility] (MRN [id number]) as of 11/23/2014 04:54  Ref. Range 11/23/2014 02:55  Hemoglobin Latest Ref Range: 12.0-16.0 g/dL 40.9  HCT Latest Ref Range: 35.0-47.0 % 37.7  MCV Latest Ref Range: 80.0-100.0 fL 90.1  MCH Latest Ref Range: 26.0-34.0 pg 30.1  MCHC Latest Ref Range: 32.0-36.0 g/dL 81.1  RDW Latest Ref Range: 11.5-14.5 % 13.6  Platelets Latest Ref Range: 150-440 K/uL 191  Neutrophils Latest Units: % 80  Lymphocytes Latest Units: % 10  Monocytes Relative Latest Units: % 10  Eosinophil Latest Units: % 0  Basophil Latest Units: % 0  NEUT# Latest Ref Range: 1.4-6.5 K/uL 8.1 (H)  Lymphocyte # Latest Ref Range: 1.0-3.6 K/uL 1.0  Monocyte # Latest Ref Range: 0.2-0.9 K/uL 1.0 (H)  Eosinophils Absolute Latest Ref Range: 0-0.7 K/uL 0.0  Basophils Absolute Latest Ref Range: 0-0.1 K/uL 0.0  Glucose Latest Ref Range: 65-99 mg/dL 91  Sample Expiration Unknown 11/26/2014  Antibody Screen Unknown NEG  ABO/RH(D) Unknown O POS  Appearance Latest Ref Range: CLEAR  CLEAR (A)  Bacteria, UA Latest Ref Range: NONE SEEN  RARE (A)  Bilirubin Urine Latest Ref Range: NEGATIVE  NEGATIVE  Color, Urine Latest Ref Range: YELLOW  YELLOW (A)  Glucose Latest Ref Range: NEGATIVE mg/dL NEGATIVE  Hgb urine dipstick Latest Ref Range: NEGATIVE  1+ (A)  Ketones, ur Latest Ref Range: NEGATIVE mg/dL 1+ (A)  Leukocytes, UA Latest Ref Range: NEGATIVE  NEGATIVE  Mucous Unknown PRESENT  Nitrite Latest Ref Range: NEGATIVE  NEGATIVE  pH Latest Ref Range: 5.0-8.0  6.0  Protein Latest Ref Range: NEGATIVE mg/dL NEGATIVE  RBC / HPF Latest Ref Range: 0-5 RBC/hpf 0-5  Specific Gravity, Urine Latest Ref Range: 1.005-1.030  1.014  Squamous Epithelial / LPF Latest Ref Range: NONE SEEN  6-30 (A)  WBC, UA Latest Ref Range: 0-5 WBC/hpf 0-5   Influenza A/B: pending  Assessment/Plan: IUP at 27+[redacted] weeks gestation Congestion r/o influenza Category 2  Fetal Tracing d/t fetal tachycardia from maternal fever, but progressing to Category 1 tracing LR IVF Bolus x 1, then D5NS /hr Wet prep and GC/Cl  Tylenol PRN  Rest  Will reassess in 2-3 hours   Karena Addison CNM 11/23/2014, 3:24 AM

## 2014-11-23 NOTE — Discharge Summary (Signed)
Obstetric Antepartum Discharge Summary Reason for Admission: 24 y.o. female G3P2 presenting for c/o RUQ abdominal pain x 1 week, decreased fetal movement, fever, and cold symptoms. Reports ongoing right-mid back pain throughout entire pregnancy while lying on her back, but improvement with repositioning. RUQ tenderness/ pain x 1 week, but denies nausea and vomiting, or pain after meals. Pt. Reports she did vomit x 2 on Saturday. Pt. Reports cold/congestion symptoms starting on Monday 9/19, and has progressively gotten worse and moved into her chest. She reports coughing up dark yellow sputum. She also reports sinus congestion without pain and dull headache. Also reports husband had similar cold and congestion symptoms x 1 week and believes he was treated with Amoxicillin and TheraFlu. Pt. Has not had anything to eat since Friday, has only been drinking liquids and then noticed decreased fetal movement. Pt. Is currently seeking prenatal care at [facility].  Prenatal Procedures: NST, IV Hydration   Recent Results (from the past 2160 hour(s))  Type and screen     Status: None   Collection Time: 11/23/14  2:55 AM  Result Value Ref Range   ABO/RH(D) O POS    Antibody Screen NEG    Sample Expiration 11/26/2014   Comprehensive metabolic panel     Status: Abnormal   Collection Time: 11/23/14  2:55 AM  Result Value Ref Range   Sodium 132 (L) 135 - 145 mmol/L   Potassium 3.3 (L) 3.5 - 5.1 mmol/L   Chloride 102 101 - 111 mmol/L   CO2 23 22 - 32 mmol/L   Glucose, Bld 91 65 - 99 mg/dL   BUN 8 6 - 20 mg/dL   Creatinine, Ser 0.55 0.44 - 1.00 mg/dL   Calcium 8.3 (L) 8.9 - 10.3 mg/dL   Total Protein 6.7 6.5 - 8.1 g/dL   Albumin 2.9 (L) 3.5 - 5.0 g/dL   AST 21 15 - 41 U/L   ALT 19 14 - 54 U/L   Alkaline Phosphatase 81 38 - 126 U/L   Total Bilirubin 1.0 0.3 - 1.2 mg/dL   GFR calc non Af Amer >60 >60 mL/min   GFR calc Af Amer >60 >60 mL/min    Comment: (NOTE) The eGFR has  been calculated using the CKD EPI equation. This calculation has not been validated in all clinical situations. eGFR's persistently <60 mL/min signify possible Chronic Kidney Disease.    Anion gap 7 5 - 15  Lipase, blood     Status: Abnormal   Collection Time: 11/23/14  2:55 AM  Result Value Ref Range   Lipase 14 (L) 22 - 51 U/L  Amylase     Status: None   Collection Time: 11/23/14  2:55 AM  Result Value Ref Range   Amylase 37 28 - 100 U/L  CBC with Differential/Platelet     Status: Abnormal   Collection Time: 11/23/14  2:55 AM  Result Value Ref Range   WBC 10.2 3.6 - 11.0 K/uL   RBC 4.18 3.80 - 5.20 MIL/uL   Hemoglobin 12.6 12.0 - 16.0 g/dL   HCT 37.7 35.0 - 47.0 %   MCV 90.1 80.0 - 100.0 fL   MCH 30.1 26.0 - 34.0 pg   MCHC 33.4 32.0 - 36.0 g/dL   RDW 13.6 11.5 - 14.5 %   Platelets 191 150 - 440 K/uL   Neutrophils Relative % 80 %   Neutro Abs 8.1 (H) 1.4 - 6.5 K/uL   Lymphocytes Relative 10 %   Lymphs Abs  1.0 1.0 - 3.6 K/uL   Monocytes Relative 10 %   Monocytes Absolute 1.0 (H) 0.2 - 0.9 K/uL   Eosinophils Relative 0 %   Eosinophils Absolute 0.0 0 - 0.7 K/uL   Basophils Relative 0 %   Basophils Absolute 0.0 0 - 0.1 K/uL  Urinalysis complete, with microscopic ([facility] only)     Status: Abnormal   Collection Time: 11/23/14  2:55 AM  Result Value Ref Range   Color, Urine YELLOW (A) YELLOW   APPearance CLEAR (A) CLEAR   Glucose, UA NEGATIVE NEGATIVE mg/dL   Bilirubin Urine NEGATIVE NEGATIVE   Ketones, ur 1+ (A) NEGATIVE mg/dL   Specific Gravity, Urine 1.014 1.005 - 1.030   Hgb urine dipstick 1+ (A) NEGATIVE   pH 6.0 5.0 - 8.0   Protein, ur NEGATIVE NEGATIVE mg/dL   Nitrite NEGATIVE NEGATIVE   Leukocytes, UA NEGATIVE NEGATIVE   RBC / HPF 0-5 0 - 5 RBC/hpf   WBC, UA 0-5 0 - 5 WBC/hpf   Bacteria, UA RARE (A) NONE SEEN   Squamous Epithelial / LPF 6-30 (A) NONE SEEN   Mucous PRESENT   ABO/Rh     Status: None   Collection Time: 11/23/14  2:55 AM  Result Value Ref Range    ABO/RH(D) O POS   Influenza A&B Antigens ([facility] only)     Status: None   Collection Time: 11/23/14  7:30 AM  Result Value Ref Range   Influenza A ([facility]) FLU A NEG CONTROL    Influenza B ([facility]) FLU B NEG CONTROL   Wet prep, genital     Status: Abnormal   Collection Time: 11/23/14 11:30 AM  Result Value Ref Range   Yeast Wet Prep HPF POC NONE SEEN NONE SEEN   Trich, Wet Prep NONE SEEN NONE SEEN   Clue Cells Wet Prep HPF POC FEW (A) NONE SEEN   WBC, Wet Prep HPF POC RARE (A) NONE SEEN  Chlamydia/NGC rt PCR ([facility] only)     Status: None   Collection Time: 11/23/14 11:30 AM  Result Value Ref Range   Specimen source GC/Chlam ENDOCERVICAL    Chlamydia Tr NOT DETECTED NOT DETECTED   N gonorrhoeae NOT DETECTED NOT DETECTED    Comment: (NOTE) 100  This methodology has not been evaluated in pregnant women or in 200  patients with a history of hysterectomy. 300 400  This methodology will not be performed on patients less than 97  years of age.    After reviewing labs and physical exam does not appear to be gallbladder, pancreas, liver, or kidney related.    Physical Exam:  General: alert, cooperative and no distress Constitutional: She is oriented to person, place, and time. She appears well-developed and well-nourished.  HENT:  Head: Normocephalic. Sinuses not tender on palpation  Eyes: Pupils are equal, round, and reactive to light.  Neck: Normal range of motion.  Cardiovascular: Normal rate, regular rhythm and normal heart sounds. Exam reveals no gallop and no friction rub.  No murmur heard. Respiratory: Effort normal and breath sounds normal. No respiratory distress. She has no wheezes. She has no rales. She exhibits no tenderness.  +Productive Cough  GI: Soft. Bowel sounds are normal. She exhibits no distension. There is no rebound and no guarding.  +RUQ slight tenderness on palpation  Genitourinary: Uterus normal. Gravid uterus. No CVAT.  Musculoskeletal: Normal range of  motion. Back now non-tender Neurological: She is alert and oriented to person, place, and time. She has normal reflexes.  Skin: Skin is warm. Psychiatric: She has a normal mood and affect. Her behavior is normal.  External genitalia: no erythema, no condyloma, no lesions, no discharge Speculum examination: +moderate thick, white discharge / cervix appears closed / no lesions / no bleeding / no pooling  Bimanual exam / VE: deferred   FHR: Baseline: 150bmp / Moderate Variability / +15x15 accels / no decels TOCO: occasional contractions with occasional UI  Discharge Diagnoses: Upper Respiratory Infection affecting pregnancy, second trimester; Bacterial Vaginosis;   Discharge Information: Date: 11/23/2014 Activity: unrestricted Diet: routine and regular, avoid foods high in fat Medications:    Medication List    TAKE these medications        acetaminophen 325 MG tablet  Commonly known as:  TYLENOL  Take 2 tablets (650 mg total) by mouth every 4 (four) hours as needed for mild pain, fever or headache.     amoxicillin-clavulanate 500-125 MG per tablet  Commonly known as:  AUGMENTIN  Take 1 tablet (500 mg total) by mouth 3 (three) times daily.     guaiFENesin 600 MG 12 hr tablet  Commonly known as:  MUCINEX  Take 1 tablet (600 mg total) by mouth 2 (two) times daily.     metroNIDAZOLE 500 MG tablet  Commonly known as:  FLAGYL  Take 1 tablet (500 mg total) by mouth 2 (two) times daily.     multivitamin tablet  Take 1 tablet by mouth daily.       Condition: stable Instructions:  1. Complete Augmentin and Flagyl Antibiotics  2. Mucinex, Tylenol, and Cepacol lozenges for supportive therapy 3. FKC's daily 4. Preterm labor precautions given  5. Return to hospital if worsening s/s, decreased FM, contractions, vaginal bleeding, LOF, inability to keep food and liquids down  Discharge to: home Follow-up Information    Follow up with [facility] In 1 week.    Why:  Fetal Heart Rate Check / Evaluate URI   Contact information:   [place] [address] [phone]       Dr. Leafy Ro aware and notified of plan of care and agrees.    Darliss Cheney CNM 11/23/2014, 3:03 PM

## 2014-11-23 NOTE — Progress Notes (Signed)
24 y.o. female presents today with complaint of cough and fever .  States that this started Friday. This has been going on for 3 days. States she initially had a headache on admission and that it was a 6/10 on pain scale, but that they gave her Tylenol and it has much approved 0-1/10 at present. She states that medication makes it better and at present fatigue makes it worse. At home has tried Tylenol to make it better, only a small dose.  She states that she has had some wheezing on occasion and a productive cough with a small amount of yellow sputum since Friday.  Her husband was sick a week ago and he improved over a few days.  She started with symptoms a couple days before Friday with a sore throat and then on Friday the cough started.  + for fever at home, fatigue, and generalized weakness.  Denies SOB, h/o asthma, and abdominal pain.  Had some nausea at home yesterday and threw-up twice when she coughed a lot.

## 2014-11-24 ENCOUNTER — Encounter: Payer: Self-pay | Admitting: *Deleted

## 2014-11-24 ENCOUNTER — Inpatient Hospital Stay
Admission: EM | Admit: 2014-11-24 | Discharge: 2014-11-27 | DRG: 781 | Disposition: A | Discharge status: Home / Self Care | Payer: Managed Care, Other (non HMO) | Attending: Obstetrics and Gynecology | Admitting: Obstetrics and Gynecology

## 2014-11-24 DIAGNOSIS — R05 Cough: Secondary | ICD-10-CM

## 2014-11-24 DIAGNOSIS — Z3A27 27 weeks gestation of pregnancy: Secondary | ICD-10-CM | POA: Diagnosis present

## 2014-11-24 DIAGNOSIS — O99512 Diseases of the respiratory system complicating pregnancy, second trimester: Principal | ICD-10-CM | POA: Diagnosis present

## 2014-11-24 DIAGNOSIS — J189 Pneumonia, unspecified organism: Secondary | ICD-10-CM | POA: Diagnosis present

## 2014-11-24 DIAGNOSIS — R509 Fever, unspecified: Secondary | ICD-10-CM

## 2014-11-24 DIAGNOSIS — O23592 Infection of other part of genital tract in pregnancy, second trimester: Secondary | ICD-10-CM | POA: Diagnosis present

## 2014-11-24 DIAGNOSIS — R059 Cough, unspecified: Secondary | ICD-10-CM

## 2014-11-24 DIAGNOSIS — E871 Hypo-osmolality and hyponatremia: Secondary | ICD-10-CM | POA: Diagnosis present

## 2014-11-24 DIAGNOSIS — N760 Acute vaginitis: Secondary | ICD-10-CM | POA: Diagnosis present

## 2014-11-24 DIAGNOSIS — J069 Acute upper respiratory infection, unspecified: Secondary | ICD-10-CM

## 2014-11-24 DIAGNOSIS — E876 Hypokalemia: Secondary | ICD-10-CM | POA: Diagnosis present

## 2014-11-24 DIAGNOSIS — O99282 Endocrine, nutritional and metabolic diseases complicating pregnancy, second trimester: Secondary | ICD-10-CM | POA: Diagnosis present

## 2014-11-24 LAB — CBC
HCT: 37.1 % (ref 35.0–47.0)
Hemoglobin: 12.5 g/dL (ref 12.0–16.0)
MCH: 30.1 pg (ref 26.0–34.0)
MCHC: 33.7 g/dL (ref 32.0–36.0)
MCV: 89.4 fL (ref 80.0–100.0)
Platelets: 141 10*3/uL — ABNORMAL LOW (ref 150–440)
RBC: 4.15 MIL/uL (ref 3.80–5.20)
RDW: 13.2 % (ref 11.5–14.5)
WBC: 6.9 10*3/uL (ref 3.6–11.0)

## 2014-11-24 LAB — COMPREHENSIVE METABOLIC PANEL
ALT: 21 U/L (ref 14–54)
AST: 28 U/L (ref 15–41)
Albumin: 2.9 g/dL — ABNORMAL LOW (ref 3.5–5.0)
Alkaline Phosphatase: 89 U/L (ref 38–126)
Anion gap: 10 (ref 5–15)
BUN: 6 mg/dL (ref 6–20)
CO2: 18 mmol/L — ABNORMAL LOW (ref 22–32)
Calcium: 8.1 mg/dL — ABNORMAL LOW (ref 8.9–10.3)
Chloride: 103 mmol/L (ref 101–111)
Creatinine, Ser: 0.48 mg/dL (ref 0.44–1.00)
GFR calc Af Amer: >60 mL/min (ref >60)
GFR calc non Af Amer: >60 mL/min (ref >60)
Glucose, Bld: 90 mg/dL (ref 65–99)
Potassium: 3.3 mmol/L — ABNORMAL LOW (ref 3.5–5.1)
Sodium: 131 mmol/L — ABNORMAL LOW (ref 135–145)
Total Bilirubin: 1.3 mg/dL — ABNORMAL HIGH (ref 0.3–1.2)
Total Protein: 6.9 g/dL (ref 6.5–8.1)

## 2014-11-24 MED ORDER — ACETAMINOPHEN 500 MG PO TABS
ORAL_TABLET | ORAL | Status: AC
  Administered 2014-11-24: 1000 mg
  Filled 2014-11-24: qty 2

## 2014-11-24 MED ORDER — ONDANSETRON HCL 4 MG/2ML IJ SOLN
4.0000 mg | Freq: Four times a day (QID) | INTRAMUSCULAR | Status: DC | PRN
  Filled 2014-11-24: qty 2

## 2014-11-24 MED ORDER — DEXTROSE-NACL 5-0.9 % IV SOLN
INTRAVENOUS | Status: DC
  Administered 2014-11-24 (×2): via INTRAVENOUS

## 2014-11-24 NOTE — H&P (Signed)
Obstetrics Admission History & Physical  Referring [place] Basham: [facility] Primary OBGYN: [facility]   Chief Complaint: 3 day hx of chills, fever, aching, pain in the Lt lower groin and cough with ST. +nausea, no vomiting. No other family members are ill at this time. History of Present Illness  24 y.o. [id number] @ [redacted]w[redacted]d (Dating: [facility] no notes available. Pregnancy complicated by: fever x 3 days.  Ms. Raeonna Milo Villalta presents for chills, aching, fever, nausea, hydration with IV 's yest and not improved tonight with T102.2. Pt does not c/o any back or abd pain tonight only Lt groin pain.   Review of Systems: Positive for nausea, chills, fever, aching all over, ST, Cough prod yellow occas. Review of Systems - neg for Headache, blurred vision, nasal dc, chest pain, SOB, dyspnea, vomiting, dysuria, frequency, urgency, diarrhea or constipation. No rash or lesions. No Vag DC or vag odor. Extrems: no edema.   Patient Active Problem List   Diagnosis Date Noted  . Labor and delivery indication for care or intervention 11/24/2014  . Pregnancy with abdominal pain of right upper quadrant, antepartum 11/23/2014  . Congestion of nasal sinus 11/23/2014  . Upper respiratory infection with cough and congestion 11/23/2014  . Bacterial vaginosis 11/23/2014   PMHx:  Past Medical History  Diagnosis Date  . Medical history non-contributory    PSHx: History reviewed. No pertinent past surgical history. Medications:  Prescriptions prior to admission  Medication Sig Dispense Refill Last Dose  . acetaminophen (TYLENOL) 325 MG tablet Take 2 tablets (650 mg total) by mouth every 4 (four) hours as needed for mild pain, fever or headache.   11/24/2014 at 0500  . metroNIDAZOLE (FLAGYL) 500 MG tablet Take 1 tablet (500 mg total) by mouth 2 (two) times daily. 14 tablet 0 11/24/2014 at Unknown time  . amoxicillin-clavulanate (AUGMENTIN) 500-125 MG per tablet Take 1 tablet (500 mg total) by mouth 3 (three) times daily. 15  tablet 0   . guaiFENesin (MUCINEX) 600 MG 12 hr tablet Take 1 tablet (600 mg total) by mouth 2 (two) times daily.     . Multiple Vitamin (MULTIVITAMIN) tablet Take 1 tablet by mouth daily.   Past Week at Unknown time   Allergies: has No Known Allergies. OBHx:  OB History  Gravida Para Term Preterm AB SAB TAB Ectopic Multiple Living  0 0 0 0 0 1 4    # Outcome Date GA Lbr Len/2nd Weight Sex Delivery Anes PTL Lv  3 Current           2A Term 03/09/09 [redacted]w[redacted]d  6 lb 11 oz (3.033 kg) F Vag-Spont None N Y  2B Term  [redacted]w[redacted]d  19 lb 2.9 oz (8.7 kg) F Vag-Spont None N Y  1 Term              GYNHx:  History of abnormal pap smears: No. History of STIs: No..             FHx: History reviewed. No pertinent family history. Soc Hx:  Social History   Social History  . Marital Status: Single    Spouse Name: N/A  . Number of Children: N/A  . Years of Education: N/A   Occupational History  . Not on file.   Social History Main Topics  . Smoking status: Never Smoker   . Smokeless tobacco: Never Used  . Alcohol Use: No  . Drug Use: No  . Sexual Activity: Yes    Birth Control/ Protection:  None   Other Topics Concern  . Not on file   Social History Narrative   FOB: unknown if involved   Objective   Filed Vitals:   11/24/14 1927  BP: 113/55  Pulse: 131  Temp: 102.2 F (39 C)  Resp: 20   Temp:  [102.2 F (39 C)] 102.2 F (39 C) (09/26 1927) Pulse Rate:  [131] 131 (09/26 1927) Resp:  [20] 20 (09/26 1927) BP: (113)/(55) 113/55 mmHg (09/26 1927) Temp (24hrs), Avg:102.2 F (39 C), Min:102.2 F (39 C), Max:102.2 F (39 C)  No intake or output data in the 24 hours ending 11/24/14 2019    Current Vital Signs 24h Vital Sign Ranges  T (!) 102.2 F (39 C) Temp  Avg: 102.2 F (39 C)  Min: 102.2 F (39 C)  Max: 102.2 F (39 C)  BP (!) 113/55 mmHg BP  Min: 113/55  Max: 113/55  HR (!) 131 Pulse  Avg: 131  Min: 131  Max: 131  RR 20 Resp  Avg: 20  Min: 20  Max: 20  SaO2     No  Data Recorded       24 Hour I/O Current Shift I/O  Time Ins Outs       EFM: 179 Toco:no UC's present  General: Well nourished, well developed female in no acute distress.  Skin:  Warm and dry.  Cardiovascular: S1S2, RRR, No M/R/G. Tachycardia Respiratory:  Clear to auscultation bilateral. Normal respiratory effort. No W/R/R. Abdomen: Gravid.  Neuro/Psych:  Normal mood and affect.    SVE: not evaluated Leopolds/EFW: 3#8oz  Labs  CBC, CMP and urine and Rapid Strept test Ultrasounds None available to review  Perinatal info  O POS/AB neg/Infulenza A & BFlu neg/Gc/Ch neg/Wet prep: neg Trich, neg Clue, neg Trich/Urine   Assessment & Plan   24 y.o. [id number] @ [redacted]w[redacted]d with signs and symptoms, with likely viral illness vs STrept throat or mono or other virus.  IUP at 27 5/7 weeks with fever and viral illness GBS: unknown  P: Labs to rule out UTI, Bacterial infection, URI, 2. IV hydration with KCL replacement if needed 3. Ext fetal and uterine monitors. Sharee Pimple, MSN, CNM, FNP [facility]

## 2014-11-25 ENCOUNTER — Observation Stay: Payer: Managed Care, Other (non HMO)

## 2014-11-25 DIAGNOSIS — N760 Acute vaginitis: Secondary | ICD-10-CM | POA: Diagnosis present

## 2014-11-25 DIAGNOSIS — O99512 Diseases of the respiratory system complicating pregnancy, second trimester: Secondary | ICD-10-CM | POA: Diagnosis present

## 2014-11-25 DIAGNOSIS — J189 Pneumonia, unspecified organism: Secondary | ICD-10-CM | POA: Diagnosis present

## 2014-11-25 DIAGNOSIS — O99282 Endocrine, nutritional and metabolic diseases complicating pregnancy, second trimester: Secondary | ICD-10-CM | POA: Diagnosis present

## 2014-11-25 DIAGNOSIS — O23592 Infection of other part of genital tract in pregnancy, second trimester: Secondary | ICD-10-CM | POA: Diagnosis present

## 2014-11-25 DIAGNOSIS — E871 Hypo-osmolality and hyponatremia: Secondary | ICD-10-CM | POA: Diagnosis present

## 2014-11-25 DIAGNOSIS — Z3A27 27 weeks gestation of pregnancy: Secondary | ICD-10-CM | POA: Diagnosis present

## 2014-11-25 DIAGNOSIS — E876 Hypokalemia: Secondary | ICD-10-CM | POA: Diagnosis present

## 2014-11-25 LAB — URINALYSIS COMPLETE WITH MICROSCOPIC (ARMC ONLY)
Bacteria, UA: NONE SEEN
Bilirubin Urine: NEGATIVE
Glucose, UA: 150 mg/dL — AB
Hgb urine dipstick: NEGATIVE
Leukocytes, UA: NEGATIVE
Nitrite: NEGATIVE
Protein, ur: 30 mg/dL — AB
Specific Gravity, Urine: 1.023 (ref 1.005–1.030)
pH: 6 (ref 5.0–8.0)

## 2014-11-25 LAB — EXPECTORATED SPUTUM ASSESSMENT W GRAM STAIN, RFLX TO RESP C: Special Requests: NORMAL

## 2014-11-25 LAB — MONONUCLEOSIS SCREEN: Mono Screen: NEGATIVE

## 2014-11-25 LAB — CBC
HCT: 34.2 % — ABNORMAL LOW (ref 35.0–47.0)
Hemoglobin: 11.7 g/dL — ABNORMAL LOW (ref 12.0–16.0)
MCH: 30.6 pg (ref 26.0–34.0)
MCHC: 34.1 g/dL (ref 32.0–36.0)
MCV: 89.5 fL (ref 80.0–100.0)
Platelets: 149 10*3/uL — ABNORMAL LOW (ref 150–440)
RBC: 3.83 MIL/uL (ref 3.80–5.20)
RDW: 13.2 % (ref 11.5–14.5)
WBC: 6.4 10*3/uL (ref 3.6–11.0)

## 2014-11-25 MED ORDER — PRENATAL MULTIVITAMIN CH
1.0000 | ORAL_TABLET | Freq: Every day | ORAL | Status: DC
  Administered 2014-11-25 – 2014-11-27 (×3): 1 via ORAL
  Filled 2014-11-25 (×4): qty 1

## 2014-11-25 MED ORDER — PHENOL 1.4 % MT LIQD
1.0000 | OROMUCOSAL | Status: DC | PRN
  Administered 2014-11-25: 1 via OROMUCOSAL
  Filled 2014-11-25: qty 177

## 2014-11-25 MED ORDER — DEXTROSE 5 % IV SOLN
1.0000 g | INTRAVENOUS | Status: DC
  Administered 2014-11-25 – 2014-11-27 (×3): 1 g via INTRAVENOUS
  Filled 2014-11-25 (×5): qty 10

## 2014-11-25 MED ORDER — GUAIFENESIN-DM 100-10 MG/5ML PO SYRP
10.0000 mL | ORAL_SOLUTION | Freq: Four times a day (QID) | ORAL | Status: DC | PRN
  Administered 2014-11-25 – 2014-11-27 (×7): 10 mL via ORAL
  Filled 2014-11-25 (×11): qty 10

## 2014-11-25 MED ORDER — KCL IN DEXTROSE-NACL 40-5-0.9 MEQ/L-%-% IV SOLN
INTRAVENOUS | Status: DC
  Administered 2014-11-25 – 2014-11-27 (×7): via INTRAVENOUS
  Filled 2014-11-25 (×13): qty 1000

## 2014-11-25 MED ORDER — DEXTROSE-NACL 5-0.9 % IV SOLN
INTRAVENOUS | Status: AC
  Administered 2014-11-25: 05:00:00 via INTRAVENOUS

## 2014-11-25 MED ORDER — ZOLPIDEM TARTRATE 5 MG PO TABS
5.0000 mg | ORAL_TABLET | Freq: Every evening | ORAL | Status: DC | PRN
  Administered 2014-11-25: 5 mg via ORAL
  Filled 2014-11-25: qty 1

## 2014-11-25 MED ORDER — AZITHROMYCIN 250 MG PO TABS
500.0000 mg | ORAL_TABLET | Freq: Every day | ORAL | Status: AC
  Administered 2014-11-25 – 2014-11-27 (×3): 500 mg via ORAL
  Filled 2014-11-25 (×3): qty 2

## 2014-11-25 MED ORDER — ACETAMINOPHEN 325 MG PO TABS
650.0000 mg | ORAL_TABLET | ORAL | Status: DC | PRN
  Administered 2014-11-25 – 2014-11-26 (×6): 650 mg via ORAL
  Filled 2014-11-25 (×6): qty 2

## 2014-11-25 NOTE — Progress Notes (Signed)
Pt temp is now down to 98.6 po.

## 2014-11-25 NOTE — Progress Notes (Addendum)
S: Resting well. O,Temp now WNL. VSS. Pulse 100, FHR 145 Earlier, Temp 103.3 po. Will add Blood cultures. WBC 6.9, Discussed earlier with Dr Feliberto Gottron. Cough prod yellow occas. Lungs CTA bilat Tol IV and feels improved. A: Fever 2. Viral Illness 3. IUP at 27  6/7 weeks P: 1. Blood cultures 2. Chest PA & Lat 3. Continue hydration

## 2014-11-25 NOTE — Plan of Care (Signed)
Sputum and urine sent to lab as ordered

## 2014-11-25 NOTE — Progress Notes (Signed)
Kimberly Mccoy is a 24 y.o. G3P2004 at [redacted]w[redacted]d  With Bilat pneumonia, cough, fever, chills, aching. No VB, no LOF, NO UC ctx's or decreased FM.  Subjective: Feeling sick  Objective: BP 124/73 mmHg  Pulse 128  Temp(Src) 98.5 F (36.9 C) (Oral)  Resp 20  SpO2 97%  LMP  (Approximate)      FHT:  FHR: 160, +accels, no decels, Cat I bpm, variability: moderate,  accelerations:  Present,  decelerations:  Absent UC:   none SVE:      Labs: Lab Results  Component Value Date   WBC 6.9 11/24/2014   HGB 12.5 11/24/2014   HCT 37.1 11/24/2014   MCV 89.4 11/24/2014   PLT 141* 11/24/2014    Assessment / Plan: IUP at 27 5/7 weeks with bilat penumonia  Labor: none Preeclampsia:  none Fetal Wellbeing:  Category I Pain Control:  aching Anticipated MOD:  None at present  P: Hospitalist to see pt for pneumonia and coverage for antibiotics. 2. Start Azithromycin per orders. 9Tripac) 3. Hydration as orderd  4. Hyponatremia: Replace Na 5. Hypokalemia: Replace K 6. Tylenol 650 mg po q 4 hours prn fever 7. Keep in L&D for now till stable.  Sharee Pimple 11/25/2014, 6:18 AM

## 2014-11-25 NOTE — Progress Notes (Signed)
Report from nursing indicating that fever is increasing. There are no rashes, tick bites, out of country hx. Lungs are clear with no abnormal BS heard but, cough is prod of yellow. Pt's PLTS are dropping so attempting to discover what is causing her origin of infection. If Chest Xray is clear, will run a RMSF next. Will hydrate for now till chest xray is back.

## 2014-11-25 NOTE — Progress Notes (Signed)
Pt seen by hospitalist. Orders noted.

## 2014-11-25 NOTE — Consult Note (Signed)
Reason for Consult:  Chief Complaint  Patient presents with  . Fever    "have had fever all day with congestion and a cough and LLQ pain"   Referring Physician: dr.schermerhorn  Kimberly Mccoy is an 24 y.o. female.   HPI: Ms. Kimberly Dano Villaltais a 24 year old G4 P2 Hispanic  female ,,presents for chills, aching, fever, nausea for 3 days. Patient was seen and evaluated by GYN on Saturday evening and was discharged home with amoxicillin and metronidazole. Patient was not feeling well and spiked temperature last night and came into the hospital. Chest x-ray was positive for pneumonia .reporting cough, chest pain and abdominal pain while coughing. Denies any sick contacts. Flu test was negative on Saturday  Review of Systems: Positive   Past Medical History  Diagnosis Date  . Medical history non-contributory   no past medical history  History reviewed. No pertinent past surgical history.  History reviewed. No pertinent family history.  Social History:  reports that she has never smoked. She has never used smokeless tobacco. She reports that she does not drink alcohol or use illicit drugs.lives with husband and children  Allergies: No Known Allergies  Medications: I have reviewed the patient's current medications.  Results for orders placed or performed during the hospital encounter of 11/24/14 (from the past 48 hour(s))  Urinalysis complete, with microscopic ([facility] only)     Status: Abnormal   Collection Time: 11/24/14  7:47 PM  Result Value Ref Range   Color, Urine AMBER (A) YELLOW   APPearance CLEAR (A) CLEAR   Glucose, UA 150 (A) NEGATIVE mg/dL   Bilirubin Urine NEGATIVE NEGATIVE   Ketones, ur 2+ (A) NEGATIVE mg/dL   Specific Gravity, Urine 1.023 1.005 - 1.030   Hgb urine dipstick NEGATIVE NEGATIVE   pH 6.0 5.0 - 8.0   Protein, ur 30 (A) NEGATIVE mg/dL   Nitrite NEGATIVE NEGATIVE   Leukocytes, UA NEGATIVE NEGATIVE   RBC / HPF 0-5 0 - 5 RBC/hpf   WBC, UA 0-5 0 - 5  WBC/hpf   Bacteria, UA NONE SEEN NONE SEEN   Squamous Epithelial / LPF 0-5 (A) NONE SEEN   Mucous PRESENT   CBC     Status: Abnormal   Collection Time: 11/24/14  8:10 PM  Result Value Ref Range   WBC 6.9 3.6 - 11.0 K/uL   RBC 4.15 3.80 - 5.20 MIL/uL   Hemoglobin 12.5 12.0 - 16.0 g/dL   HCT 37.1 35.0 - 47.0 %   MCV 89.4 80.0 - 100.0 fL   MCH 30.1 26.0 - 34.0 pg   MCHC 33.7 32.0 - 36.0 g/dL   RDW 13.2 11.5 - 14.5 %   Platelets 141 (L) 150 - 440 K/uL  Comprehensive metabolic panel     Status: Abnormal   Collection Time: 11/24/14  8:10 PM  Result Value Ref Range   Sodium 131 (L) 135 - 145 mmol/L   Potassium 3.3 (L) 3.5 - 5.1 mmol/L   Chloride 103 101 - 111 mmol/L   CO2 18 (L) 22 - 32 mmol/L   Glucose, Bld 90 65 - 99 mg/dL   BUN 6 6 - 20 mg/dL   Creatinine, Ser 0.48 0.44 - 1.00 mg/dL   Calcium 8.1 (L) 8.9 - 10.3 mg/dL   Total Protein 6.9 6.5 - 8.1 g/dL   Albumin 2.9 (L) 3.5 - 5.0 g/dL   AST 28 15 - 41 U/L   ALT 21 14 - 54 U/L   Alkaline Phosphatase 89 38 -  126 U/L   Total Bilirubin 1.3 (H) 0.3 - 1.2 mg/dL   GFR calc non Af Amer >60 >60 mL/min   GFR calc Af Amer >60 >60 mL/min    Comment: (NOTE) The eGFR has been calculated using the CKD EPI equation. This calculation has not been validated in all clinical situations. eGFR's persistently <60 mL/min signify possible Chronic Kidney Disease.    Anion gap 10 5 - 15  Mononucleosis screen     Status: None   Collection Time: 11/24/14  8:27 PM  Result Value Ref Range   Mono Screen NEGATIVE NEGATIVE  Culture, group A strep ([facility] only)     Status: None (Preliminary result)   Collection Time: 11/24/14  9:20 PM  Result Value Ref Range   Specimen Description THROAT    Special Requests NONE    Culture NO BETA HEMOLYTIC STREPTOCOCCI ISOLATED    Report Status PENDING   Culture, blood (routine x 2)     Status: None (Preliminary result)   Collection Time: 11/25/14  1:26 AM  Result Value Ref Range   Specimen Description BLOOD LEFT  ANTECUBITAL    Special Requests BOTTLES DRAWN AEROBIC AND ANAEROBIC 6ML    Culture NO GROWTH < 12 HOURS    Report Status PENDING   Culture, blood (routine x 2)     Status: None (Preliminary result)   Collection Time: 11/25/14  1:49 AM  Result Value Ref Range   Specimen Description BLOOD    Special Requests BOTTLES DRAWN AEROBIC AND ANAEROBIC 5ML    Culture NO GROWTH < 12 HOURS    Report Status PENDING   CBC     Status: Abnormal   Collection Time: 11/25/14  9:51 AM  Result Value Ref Range   WBC 6.4 3.6 - 11.0 K/uL   RBC 3.83 3.80 - 5.20 MIL/uL   Hemoglobin 11.7 (L) 12.0 - 16.0 g/dL   HCT 34.2 (L) 35.0 - 47.0 %   MCV 89.5 80.0 - 100.0 fL   MCH 30.6 26.0 - 34.0 pg   MCHC 34.1 32.0 - 36.0 g/dL   RDW 13.2 11.5 - 14.5 %   Platelets 149 (L) 150 - 440 K/uL    Dg Chest 2 View  11/25/2014   CLINICAL DATA:  Cough and fever.  Second trimester pregnancy.  EXAM: CHEST  2 VIEW  COMPARISON:  08/02/2011  FINDINGS: Bilateral airspace disease consistent with pneumonia. No cavitation or pleural effusion. Normal heart size and mediastinal contours.  IMPRESSION: Bilateral pneumonia.   Electronically Signed   By: Monte Fantasia M.D.   On: 11/25/2014 05:06    ROS:  CONSTITUTIONAL:reporting fevers, chills. Denies any fatigue, weakness.  EYES: Denies blurry vision, double vision, eye pain. EARS, NOSE, THROAT: Denies tinnitus, ear pain, hearing loss. RESPIRATORY: reports cough, denies wheezing, shortness of breath.  CARDIOVASCULAR: Denies chest pain, palpitations, edema.  GASTROINTESTINAL: Denies nausea, vomiting, diarrhea, abdominal pain. Denies bright red blood per rectum. GENITOURINARY: Denies dysuria, hematuria. ENDOCRINE: Denies nocturia or thyroid problems. HEMATOLOGIC AND LYMPHATIC: Denies easy bruising or bleeding. SKIN: Denies rash or lesion. MUSCULOSKELETAL: Denies pain in neck, back, shoulder, knees, hips or arthritic symptoms.  NEUROLOGIC: Denies paralysis, paresthesias.  PSYCHIATRIC:  Denies anxiety or depressive symptoms. Blood pressure 124/73, pulse 128, temperature 98.9 F (37.2 C), temperature source Oral, resp. rate 20, SpO2 97 %.   PHYSICAL EXAMINATION:  GENERAL: Well-nourished, well-developed , currently in no acute distress.  HEAD: Normocephalic, atraumatic.  EYES: Pupils equal, round, and reactive to light. Extraocular muscles intact.  No scleral icterus.  MOUTH: Moist mucosal membranes. Dentition intact. No abscess noted. EARS, NOSE, THROAT: Clear without exudates. No external lesions.  NECK: Supple. No thyromegaly. No nodules. No JVD.  PULMONARY: moderate air entry bilaterally with crackles, no rales, or rhonchi. No use of accessory muscles. Good respiratory effort..inframammary area is tender to palpation CHEST: Nontender to palpation.  CARDIOVASCULAR: S1, S2, regular rate and rhythm. No murmurs, rubs, or gallops.  GASTROINTESTINAL: Soft, nontender, nondistended. No masses. Positive bowel sounds. No hepatosplenomegaly. MUSCULOSKELETAL: No swelling, clubbing, edema. Range of motion full in all extremities. NEUROLOGIC: Cranial nerves II-XII intact. No gross focal neurological deficits. Sensation intact. Reflexes intact. SKIN: No ulcerations, lesions, rash, cyanosis. Skin warm, dry. Turgor intact. PSYCHIATRIC: Mood, affect within normal limits. Patient awake, alert, oriented x 3. Insight and judgment intact.   Assessment/Plan:   1.pneumonia most likely community-acquired  Will get sputum culture and sensitivity Started patient on IV Rocephin and azithromycin Will provide cough medicine as needed basis Will check urine for Legionella antigen and mycoplasma antibody to rule out walking pneumonia Flu test is negative Repeat CBC and BMP in a.m. The risks and benefits of antibiotics and cough medicine was explained to the patient. She is aware that this is probably safe to use during pregnancy as benefits outweigh the risks  2. Productive cough Will provide  guaifenesin with dextromethorphan  3. Chest wall tenderness from cough Provide Tylenol as needed basis    The diagnosis and plan of care was discussed in detail with the patient with the help of Spanish interpreter Kennyth Lose More than 50% of time was spent on coordination of care    TOTAL TIME TAKING CARE OF THIS PATIENT: 52mnutes.  @[facility] @ Pager - [phone]/27/2016, 2:59 PM

## 2014-11-25 NOTE — Progress Notes (Signed)
Patient ID: Kimberly Mccoy, female   DOB: Dec 02, 1990, 24 y.o.   MRN: [id number] HD#1 27+4 week admitted for bilateral pneumonia . IM consultation completed . Currently on zithromax and IV Rocephin . Feels alittle better . Husband had similar 2 weeks ago  O;100.3  2 hrs ago  Lungs CTA . BS good  CV RRR without murmur Adb : soft  NST : reassuring  A: Pneumonia bilat mycoplasma vs atypical . Hypokalemia ,hyponatremia  P: cont ABX  IVF D5 NS + KCL  Repeat labs in am  Transfer to floor

## 2014-11-25 NOTE — Progress Notes (Signed)
Pt up walking around in room at present time. efm off.

## 2014-11-26 LAB — BASIC METABOLIC PANEL
Anion gap: 6 (ref 5–15)
BUN: <5 mg/dL — ABNORMAL LOW (ref 6–20)
CO2: 20 mmol/L — ABNORMAL LOW (ref 22–32)
Calcium: 7.6 mg/dL — ABNORMAL LOW (ref 8.9–10.3)
Chloride: 111 mmol/L (ref 101–111)
Creatinine, Ser: 0.31 mg/dL — ABNORMAL LOW (ref 0.44–1.00)
GFR calc Af Amer: >60 mL/min (ref >60)
GFR calc non Af Amer: >60 mL/min (ref >60)
Glucose, Bld: 104 mg/dL — ABNORMAL HIGH (ref 65–99)
Potassium: 3.5 mmol/L (ref 3.5–5.1)
Sodium: 137 mmol/L (ref 135–145)

## 2014-11-26 LAB — CBC
HCT: 33.1 % — ABNORMAL LOW (ref 35.0–47.0)
Hemoglobin: 11 g/dL — ABNORMAL LOW (ref 12.0–16.0)
MCH: 29.8 pg (ref 26.0–34.0)
MCHC: 33.3 g/dL (ref 32.0–36.0)
MCV: 89.4 fL (ref 80.0–100.0)
Platelets: 142 10*3/uL — ABNORMAL LOW (ref 150–440)
RBC: 3.71 MIL/uL — ABNORMAL LOW (ref 3.80–5.20)
RDW: 13.6 % (ref 11.5–14.5)
WBC: 5.2 10*3/uL (ref 3.6–11.0)

## 2014-11-26 LAB — LEGIONELLA PNEUMOPHILA SEROGP 1 UR AG: L. pneumophila Serogp 1 Ur Ag: NEGATIVE

## 2014-11-26 LAB — CULTURE, GROUP A STREP (THRC)

## 2014-11-26 LAB — MYCOPLASMA PNEUMONIAE ANTIBODY, IGM: Mycoplasma pneumo IgM: <770 U/mL (ref 0–769)

## 2014-11-26 NOTE — Progress Notes (Signed)
RN remains at the beside holds external monitor in place...in order to obtain fetal tracing.  When patient coughs, fetal tracing is lost.

## 2014-11-26 NOTE — Progress Notes (Signed)
[facility] [place] [facility]   PATIENT NAME: Kimberly Mccoy    MR#:  [id number]  DATE OF BIRTH:  02-02-1991  SUBJECTIVE:  CHIEF COMPLAINT:  Patient is feeling much better. Decreased cough and improved shortness of breath  REVIEW OF SYSTEMS:  CONSTITUTIONAL: No fever, fatigue or weakness.  EYES: No blurred or double vision.  EARS, NOSE, AND THROAT: No tinnitus or ear pain.  RESPIRATORY:  reports cough and shortness of breath are better, wheezing or hemoptysis.  CARDIOVASCULAR: No chest pain, orthopnea, edema.  GASTROINTESTINAL: No nausea, vomiting, diarrhea or abdominal pain.  GENITOURINARY: No dysuria, hematuria.  ENDOCRINE: No polyuria, nocturia,  HEMATOLOGY: No anemia, easy bruising or bleeding SKIN: No rash or lesion. MUSCULOSKELETAL: No joint pain or arthritis.   NEUROLOGIC: No tingling, numbness, weakness.  PSYCHIATRY: No anxiety or depression.   DRUG ALLERGIES:  No Known Allergies  VITALS:  Blood pressure 100/56, pulse 79, temperature 98 F (36.7 C), temperature source Oral, resp. rate 20, height 5' (1.524 m), weight 86.183 kg (190 lb), SpO2 98 %.  PHYSICAL EXAMINATION:  GENERAL:  24 y.o.-year-old patient lying in the bed with no acute distress.  EYES: Pupils equal, round, reactive to light and accommodation. No scleral icterus. Extraocular muscles intact.  HEENT: Head atraumatic, normocephalic. Oropharynx and nasopharynx clear.  NECK:  Supple, no jugular venous distention. No thyroid enlargement, no tenderness.  LUNGS: Moderate air entry with decreased breath sounds at the right lung bases, no wheezing, rales,rhonchi or crepitation. No use of accessory muscles of respiration.  CARDIOVASCULAR: S1, S2 normal. No murmurs, rubs, or gallops.  ABDOMEN: Soft, nontender, nondistended. Bowel sounds present. No organomegaly or mass.  EXTREMITIES: No pedal edema, cyanosis, or clubbing.  NEUROLOGIC: Cranial nerves II through XII are intact.  Muscle strength 5/5 in all extremities. Sensation intact. Gait not checked.  PSYCHIATRIC: The patient is alert and oriented x 3.  SKIN: No obvious rash, lesion, or ulcer.    LABORATORY PANEL:   CBC  Recent Labs Lab 11/26/14 0638  WBC 5.2  HGB 11.0*  HCT 33.1*  PLT 142*   ------------------------------------------------------------------------------------------------------------------  Chemistries   Recent Labs Lab 11/24/14 2010 11/26/14 0638  NA 131* 137  K 3.3* 3.5  CL 103 111  CO2 18* 20*  GLUCOSE 90 104*  BUN 6 <5*  CREATININE 0.48 0.31*  CALCIUM 8.1* 7.6*  AST 28  --   ALT 21  --   ALKPHOS 89  --   BILITOT 1.3*  --    ------------------------------------------------------------------------------------------------------------------  Cardiac Enzymes No results for input(s): TROPONINI in the last 168 hours. ------------------------------------------------------------------------------------------------------------------  RADIOLOGY:  Dg Chest 2 View  11/25/2014   CLINICAL DATA:  Cough and fever.  Second trimester pregnancy.  EXAM: CHEST  2 VIEW  COMPARISON:  08/02/2011  FINDINGS: Bilateral airspace disease consistent with pneumonia. No cavitation or pleural effusion. Normal heart size and mediastinal contours.  IMPRESSION: Bilateral pneumonia.   Electronically Signed   By: Marnee Spring M.D.   On: 11/25/2014 05:06    EKG:  No orders found for this or any previous visit.  ASSESSMENT AND PLAN:   1.pneumonia most likely community-acquired  Clinically improving  Pending sputum culture and sensitivity Continue patient on IV Rocephin and azithromycin. If clinically better will consider discharging the patient with by mouth Augmentin in a.m. for 7 days Will provide cough medicine as needed basis Pending urine for Legionella antigen and mycoplasma antibody to rule out walking pneumonia Flu test is negative Repeat  CBC and BMP in a.m. The risks and benefits of  antibiotics and cough medicine was explained to the patient. She is aware that this is probably safe to use during pregnancy as benefits outweigh the risks  2. Productive cough Will provide guaifenesin with dextromethorphan  3. Chest wall tenderness from cough Provide Tylenol as needed basis     All the records are reviewed and case discussed with Care Management/Social Workerr. Management plans discussed with the patient, family and they are in agreement. Spanish interpreter Ms. Jackie  facilitated the interpretationAnnice PihODE STATUS: Full code  TOTAL TIME TAKING CARE OF THIS PATIENT: 35 minutes.   Anticipate discharge from medical standpoint in a.m.   Ramonita Lab M.D on 11/26/2014 at 2:22 PM  Between 7am to 6pm - Pager - [phone] After 6pm go to [email or web address] - password [email or web address] [facility]  [place] [facility]  Office  [phone]  CC: Primary care physician; No PCP Per Patient

## 2014-11-26 NOTE — Progress Notes (Signed)
BP Charge Nurse- Asher Muir notified of need for NST today.

## 2014-11-26 NOTE — Progress Notes (Signed)
Subjective:no  Feels better today . Still coughing .Tmax 100.2@ 0400. On day 2 of Rocephin and zithromax  Patient reports Objective: I have reviewed patient's vital signs and labs.  General: alert and cooperative Resp: diminished breath sounds RLL Cardio: regular rate and rhythm, S1, S2 normal, no murmur, click, rub or gallop GI: soft, non-tender; bowel sounds normal; no masses,  no organomegaly Abd: gravid soft NT   Assessment/Plan: Bilateral pneumonia , improving .  Hypokalemia and hyponatremia improved Cont supportive care + ABx . NST today If afebrile today possible d/c in am     LOS: 1 day    SCHERMERHORN,THOMAS 11/26/2014, 9:05 AM

## 2014-11-26 NOTE — Progress Notes (Signed)
Pt. Transferred via WC back to Mother/Baby. Family at the bedside, Mother/Baby nurse informed.

## 2014-11-26 NOTE — Progress Notes (Signed)
Notify Kimberly Mccoy of bp

## 2014-11-26 NOTE — Progress Notes (Signed)
Pt. On the unit via WC from Mother/Baby for daily NST. Room OBS #3.

## 2014-11-27 LAB — CULTURE, RESPIRATORY W GRAM STAIN: Culture: NORMAL

## 2014-11-27 LAB — CULTURE, RESPIRATORY: Special Requests: NORMAL

## 2014-11-27 MED ORDER — GUAIFENESIN-DM 100-10 MG/5ML PO SYRP: 10.0000 mL | ORAL_SOLUTION | Freq: Four times a day (QID) | ORAL | Status: DC | PRN

## 2014-11-27 MED ORDER — AMOXICILLIN-POT CLAVULANATE 500-125 MG PO TABS: 1.0000 | ORAL_TABLET | Freq: Three times a day (TID) | ORAL | Status: DC

## 2014-11-27 NOTE — Progress Notes (Signed)
Pt VSS, assessment WNL. Discharge instructions given, went over, verbalizes understanding. Follow-up appointment made and reviewed with patient.

## 2014-11-27 NOTE — Discharge Instructions (Signed)
Pneumonia °Pneumonia is an infection of the lungs.  °CAUSES °Pneumonia may be caused by bacteria or a virus. Usually, these infections are caused by breathing infectious particles into the lungs (respiratory tract). °SIGNS AND SYMPTOMS  °· Cough. °· Fever. °· Chest pain. °· Increased rate of breathing. °· Wheezing. °· Mucus production. °DIAGNOSIS  °If you have the common symptoms of pneumonia, your health care provider will typically confirm the diagnosis with a chest X-ray. The X-ray will show an abnormality in the lung (pulmonary infiltrate) if you have pneumonia. Other tests of your blood, urine, or sputum may be done to find the specific cause of your pneumonia. Your health care provider may also do tests (blood gases or pulse oximetry) to see how well your lungs are working. °TREATMENT  °Some forms of pneumonia may be spread to other people when you cough or sneeze. You may be asked to wear a mask before and during your exam. Pneumonia that is caused by bacteria is treated with antibiotic medicine. Pneumonia that is caused by the influenza virus may be treated with an antiviral medicine. Most other viral infections must run their course. These infections will not respond to antibiotics.  °HOME CARE INSTRUCTIONS  °· Cough suppressants may be used if you are losing too much rest. However, coughing protects you by clearing your lungs. You should avoid using cough suppressants if you can. °· Your health care provider may have prescribed medicine if he or she thinks your pneumonia is caused by bacteria or influenza. Finish your medicine even if you start to feel better. °· Your health care provider may also prescribe an expectorant. This loosens the mucus to be coughed up. °· Take medicines only as directed by your health care provider. °· Do not smoke. Smoking is a common cause of bronchitis and can contribute to pneumonia. If you are a smoker and continue to smoke, your cough may last several weeks after your  pneumonia has cleared. °· A cold steam vaporizer or humidifier in your room or home may help loosen mucus. °· Coughing is often worse at night. Sleeping in a semi-upright position in a recliner or using a couple pillows under your head will help with this. °· Get rest as you feel it is needed. Your body will usually let you know when you need to rest. °PREVENTION °A pneumococcal shot (vaccine) is available to prevent a common bacterial cause of pneumonia. This is usually suggested for: °· People over 65 years old. °· Patients on chemotherapy. °· People with chronic lung problems, such as bronchitis or emphysema. °· People with immune system problems. °If you are over 65 or have a high risk condition, you may receive the pneumococcal vaccine if you have not received it before. In some countries, a routine influenza vaccine is also recommended. This vaccine can help prevent some cases of pneumonia. You may be offered the influenza vaccine as part of your care. °If you smoke, it is time to quit. You may receive instructions on how to stop smoking. Your health care provider can provide medicines and counseling to help you quit. °SEEK MEDICAL CARE IF: °You have a fever. °SEEK IMMEDIATE MEDICAL CARE IF:  °· Your illness becomes worse. This is especially true if you are elderly or weakened from any other disease. °· You cannot control your cough with suppressants and are losing sleep. °· You begin coughing up blood. °· You develop pain which is getting worse or is uncontrolled with medicines. °· Any of the symptoms   which initially brought you in for treatment are getting worse rather than better.  You develop shortness of breath or chest pain. MAKE SURE YOU:   Understand these instructions.  Will watch your condition.  Will get help right away if you are not doing well or get worse.

## 2014-11-27 NOTE — Progress Notes (Signed)
ANTEPARTUM NOTE - Hospital Day # 2  Subjective: Reports feeling somewhat better, but it still hurts to cough Denies coughing up mucous Tolerating po intake / no nausea / no vomiting   Voiding QS Denies vaginal bleeding, LOF, contractions Fetal activity: good FM   Objective: Vital signs: VS: Blood pressure 98/56, pulse 86, temperature 97.5 F (36.4 C), temperature source Oral, resp. rate 20, height 5' (1.524 m), weight 86.183 kg (190 lb), SpO2 98 %.  Labs: All labs reviewed with Dr. Feliberto Gottron   Physical exam:        General appearance/behavior: alert, NAD       Heart: S1, S2, RRR       Lungs: coarse lung sounds in bilateral lower lobes, clear in upper lobes, +cough, no wheezing or rales       Abdomen: gravid, non-tender, soft       Extremities: no edema, no calf pain or tenderness, 1+ DTRS, no clonus        Fetal Assessment:  Doppler: 125 bmp   Assessment: 28 weeks weeks gestation Bilateral Pneumonia - improving, afebrile Hypokalemia and hyponatremia - improved   Plan:  NST today, if reactive, okay to go home Reassess this afternoon   Dr Feliberto Gottron updated with patient status / plan of care

## 2014-11-27 NOTE — Discharge Summary (Signed)
Patient ID: Kimberly Mccoy MRN: [id number] DOB/AGE: Oct 07, 1990 24 y.o.  Admit date: 11/24/2014 Admission Diagnoses: 27+[redacted] weeks gestation Bilateral Pneumonia affecting pregnancy, Hypokalemia and hyponatremia   Discharge date:  11/27/14 Discharge Diagnoses:  Bilateral Pneumonia affecting pregnancy   Prenatal history: G3P2002   EDC : 02/19/2015, by Ultrasound  Prenatal care at [facility] Prenatal course complicated by: bilateral pneumonia, bacterial vaginosis   Prenatal Labs: ABO, Rh: --/--/O POS, O POS (09/25 0255) Antibody: NEG (09/25 0255) HBsAg: Negative (06/16 0000)  HIV: Non-reactive (06/15 0000)  GBS:   unknown  Medical / Surgical History :  Past medical history:  Past Medical History  Diagnosis Date  . Medical history non-contributory     Past surgical history: History reviewed. No pertinent past surgical history.  Family History: History reviewed. No pertinent family history.  Social History:  reports that she has never smoked. She has never used smokeless tobacco. She reports that she does not drink alcohol or use illicit drugs.  Allergies: Review of patient's allergies indicates no known allergies.   Current Medications at time of admission:  Prior to Admission medications   Medication Sig Start Date End Date Taking? Authorizing Provider  acetaminophen (TYLENOL) 325 MG tablet Take 2 tablets (650 mg total) by mouth every 4 (four) hours as needed for mild pain, fever or headache. 11/23/14  Yes Meredith C Sigmon, CNM  metroNIDAZOLE (FLAGYL) 500 MG tablet Take 1 tablet (500 mg total) by mouth 2 (two) times daily. 11/23/14  Yes Meredith C Sigmon, CNM  amoxicillin-clavulanate (AUGMENTIN) 500-125 MG tablet Take 1 tablet (500 mg total) by mouth 3 (three) times daily. 11/27/14   Nicholes Mango, MD  guaiFENesin (MUCINEX) 600 MG 12 hr tablet Take 1 tablet (600 mg total) by mouth 2 (two) times daily. 11/23/14   Tyler Deis Sigmon, CNM   guaiFENesin-dextromethorphan (ROBITUSSIN DM) 100-10 MG/5ML syrup Take 10 mLs by mouth every 6 (six) hours as needed for cough. 11/27/14   Nicholes Mango, MD  Multiple Vitamin (MULTIVITAMIN) tablet Take 1 tablet by mouth daily.    Historical Provider, MD    Hospital Course:   Internal Medicine consult: treated with Azithromax, Rocephin, Robitussin DM, D5NS with 63mq of KCL  CLINICAL DATA: Cough and fever. Second trimester pregnancy.  EXAM: CHEST 2 VIEW  COMPARISON: 08/02/2011  FINDINGS: Bilateral airspace disease consistent with pneumonia. No cavitation or pleural effusion. Normal heart size and mediastinal contours.  IMPRESSION: Bilateral pneumonia.  Electronically Signed  By: JMonte FantasiaM.D.  On: 11/25/2014 05:06    Recent Results (from the past 2160 hour(s))  Type and screen     Status: None   Collection Time: 11/23/14  2:55 AM  Result Value Ref Range   ABO/RH(D) O POS    Antibody Screen NEG    Sample Expiration 11/26/2014   Comprehensive metabolic panel     Status: Abnormal   Collection Time: 11/23/14  2:55 AM  Result Value Ref Range   Sodium 132 (L) 135 - 145 mmol/L   Potassium 3.3 (L) 3.5 - 5.1 mmol/L   Chloride 102 101 - 111 mmol/L   CO2 23 22 - 32 mmol/L   Glucose, Bld 91 65 - 99 mg/dL   BUN 8 6 - 20 mg/dL   Creatinine, Ser 0.55 0.44 - 1.00 mg/dL   Calcium 8.3 (L) 8.9 - 10.3 mg/dL   Total Protein 6.7 6.5 - 8.1 g/dL   Albumin 2.9 (L) 3.5 - 5.0 g/dL   AST 21 15 - 41 U/L  ALT 19 14 - 54 U/L   Alkaline Phosphatase 81 38 - 126 U/L   Total Bilirubin 1.0 0.3 - 1.2 mg/dL   GFR calc non Af Amer >60 >60 mL/min   GFR calc Af Amer >60 >60 mL/min    Comment: (NOTE) The eGFR has been calculated using the CKD EPI equation. This calculation has not been validated in all clinical situations. eGFR's persistently <60 mL/min signify possible Chronic Kidney Disease.    Anion gap 7 5 - 15  Lipase, blood     Status: Abnormal   Collection Time: 11/23/14   2:55 AM  Result Value Ref Range   Lipase 14 (L) 22 - 51 U/L  Amylase     Status: None   Collection Time: 11/23/14  2:55 AM  Result Value Ref Range   Amylase 37 28 - 100 U/L  CBC with Differential/Platelet     Status: Abnormal   Collection Time: 11/23/14  2:55 AM  Result Value Ref Range   WBC 10.2 3.6 - 11.0 K/uL   RBC 4.18 3.80 - 5.20 MIL/uL   Hemoglobin 12.6 12.0 - 16.0 g/dL   HCT 37.7 35.0 - 47.0 %   MCV 90.1 80.0 - 100.0 fL   MCH 30.1 26.0 - 34.0 pg   MCHC 33.4 32.0 - 36.0 g/dL   RDW 13.6 11.5 - 14.5 %   Platelets 191 150 - 440 K/uL   Neutrophils Relative % 80 %   Neutro Abs 8.1 (H) 1.4 - 6.5 K/uL   Lymphocytes Relative 10 %   Lymphs Abs 1.0 1.0 - 3.6 K/uL   Monocytes Relative 10 %   Monocytes Absolute 1.0 (H) 0.2 - 0.9 K/uL   Eosinophils Relative 0 %   Eosinophils Absolute 0.0 0 - 0.7 K/uL   Basophils Relative 0 %   Basophils Absolute 0.0 0 - 0.1 K/uL  Urinalysis complete, with microscopic ([facility] only)     Status: Abnormal   Collection Time: 11/23/14  2:55 AM  Result Value Ref Range   Color, Urine YELLOW (A) YELLOW   APPearance CLEAR (A) CLEAR   Glucose, UA NEGATIVE NEGATIVE mg/dL   Bilirubin Urine NEGATIVE NEGATIVE   Ketones, ur 1+ (A) NEGATIVE mg/dL   Specific Gravity, Urine 1.014 1.005 - 1.030   Hgb urine dipstick 1+ (A) NEGATIVE   pH 6.0 5.0 - 8.0   Protein, ur NEGATIVE NEGATIVE mg/dL   Nitrite NEGATIVE NEGATIVE   Leukocytes, UA NEGATIVE NEGATIVE   RBC / HPF 0-5 0 - 5 RBC/hpf   WBC, UA 0-5 0 - 5 WBC/hpf   Bacteria, UA RARE (A) NONE SEEN   Squamous Epithelial / LPF 6-30 (A) NONE SEEN   Mucous PRESENT   ABO/Rh     Status: None   Collection Time: 11/23/14  2:55 AM  Result Value Ref Range   ABO/RH(D) O POS   Influenza A&B Antigens ([facility] only)     Status: None   Collection Time: 11/23/14  7:30 AM  Result Value Ref Range   Influenza A ([facility]) FLU A NEG CONTROL    Influenza B ([facility]) FLU B NEG CONTROL   Wet prep, genital     Status: Abnormal   Collection Time:  11/23/14 11:30 AM  Result Value Ref Range   Yeast Wet Prep HPF POC NONE SEEN NONE SEEN   Trich, Wet Prep NONE SEEN NONE SEEN   Clue Cells Wet Prep HPF POC FEW (A) NONE SEEN   WBC, Wet Prep HPF POC RARE (A)  NONE SEEN  Chlamydia/NGC rt PCR ([facility] only)     Status: None   Collection Time: 11/23/14 11:30 AM  Result Value Ref Range   Specimen source GC/Chlam ENDOCERVICAL    Chlamydia Tr NOT DETECTED NOT DETECTED   N gonorrhoeae NOT DETECTED NOT DETECTED    Comment: (NOTE) 100  This methodology has not been evaluated in pregnant women or in 200  patients with a history of hysterectomy. 300 400  This methodology will not be performed on patients less than 53  years of age.   Urinalysis complete, with microscopic ([facility] only)     Status: Abnormal   Collection Time: 11/24/14  7:47 PM  Result Value Ref Range   Color, Urine AMBER (A) YELLOW   APPearance CLEAR (A) CLEAR   Glucose, UA 150 (A) NEGATIVE mg/dL   Bilirubin Urine NEGATIVE NEGATIVE   Ketones, ur 2+ (A) NEGATIVE mg/dL   Specific Gravity, Urine 1.023 1.005 - 1.030   Hgb urine dipstick NEGATIVE NEGATIVE   pH 6.0 5.0 - 8.0   Protein, ur 30 (A) NEGATIVE mg/dL   Nitrite NEGATIVE NEGATIVE   Leukocytes, UA NEGATIVE NEGATIVE   RBC / HPF 0-5 0 - 5 RBC/hpf   WBC, UA 0-5 0 - 5 WBC/hpf   Bacteria, UA NONE SEEN NONE SEEN   Squamous Epithelial / LPF 0-5 (A) NONE SEEN   Mucous PRESENT   CBC     Status: Abnormal   Collection Time: 11/24/14  8:10 PM  Result Value Ref Range   WBC 6.9 3.6 - 11.0 K/uL   RBC 4.15 3.80 - 5.20 MIL/uL   Hemoglobin 12.5 12.0 - 16.0 g/dL   HCT 37.1 35.0 - 47.0 %   MCV 89.4 80.0 - 100.0 fL   MCH 30.1 26.0 - 34.0 pg   MCHC 33.7 32.0 - 36.0 g/dL   RDW 13.2 11.5 - 14.5 %   Platelets 141 (L) 150 - 440 K/uL  Comprehensive metabolic panel     Status: Abnormal   Collection Time: 11/24/14  8:10 PM  Result Value Ref Range   Sodium 131 (L) 135 - 145 mmol/L   Potassium 3.3 (L) 3.5 - 5.1 mmol/L   Chloride 103 101 - 111  mmol/L   CO2 18 (L) 22 - 32 mmol/L   Glucose, Bld 90 65 - 99 mg/dL   BUN 6 6 - 20 mg/dL   Creatinine, Ser 0.48 0.44 - 1.00 mg/dL   Calcium 8.1 (L) 8.9 - 10.3 mg/dL   Total Protein 6.9 6.5 - 8.1 g/dL   Albumin 2.9 (L) 3.5 - 5.0 g/dL   AST 28 15 - 41 U/L   ALT 21 14 - 54 U/L   Alkaline Phosphatase 89 38 - 126 U/L   Total Bilirubin 1.3 (H) 0.3 - 1.2 mg/dL   GFR calc non Af Amer >60 >60 mL/min   GFR calc Af Amer >60 >60 mL/min    Comment: (NOTE) The eGFR has been calculated using the CKD EPI equation. This calculation has not been validated in all clinical situations. eGFR's persistently <60 mL/min signify possible Chronic Kidney Disease.    Anion gap 10 5 - 15  Mononucleosis screen     Status: None   Collection Time: 11/24/14  8:27 PM  Result Value Ref Range   Mono Screen NEGATIVE NEGATIVE  Culture, group A strep ([facility] only)     Status: None   Collection Time: 11/24/14  9:20 PM  Result Value Ref Range   Specimen Description  THROAT    Special Requests NONE    Culture NO BETA HEMOLYTIC STREPTOCOCCI ISOLATED    Report Status 11/26/2014 FINAL   Culture, blood (routine x 2)     Status: None (Preliminary result)   Collection Time: 11/25/14  1:26 AM  Result Value Ref Range   Specimen Description BLOOD LEFT ANTECUBITAL    Special Requests BOTTLES DRAWN AEROBIC AND ANAEROBIC 6ML    Culture NO GROWTH 2 DAYS    Report Status PENDING   Culture, blood (routine x 2)     Status: None (Preliminary result)   Collection Time: 11/25/14  1:49 AM  Result Value Ref Range   Specimen Description BLOOD    Special Requests BOTTLES DRAWN AEROBIC AND ANAEROBIC 5ML    Culture NO GROWTH 2 DAYS    Report Status PENDING   CBC     Status: Abnormal   Collection Time: 11/25/14  9:51 AM  Result Value Ref Range   WBC 6.4 3.6 - 11.0 K/uL   RBC 3.83 3.80 - 5.20 MIL/uL   Hemoglobin 11.7 (L) 12.0 - 16.0 g/dL   HCT 34.2 (L) 35.0 - 47.0 %   MCV 89.5 80.0 - 100.0 fL   MCH 30.6 26.0 - 34.0 pg   MCHC 34.1 32.0  - 36.0 g/dL   RDW 13.2 11.5 - 14.5 %   Platelets 149 (L) 150 - 440 K/uL  Mycoplasma pneumoniae antibody, IgM     Status: None   Collection Time: 11/25/14  9:51 AM  Result Value Ref Range   Mycoplasma pneumo IgM <770 0 - 769 U/mL    Comment: (NOTE)                             Negative            <770 Clinically significant amount of M. pneumoniae antibody not detected.                             Low Positive   770 - 16 M. pneumoniae specific IgM presumptively detected.  It is recommended that another sample be collected 1-2 weeks later to assure reactivity.                             Positive            >950 Highly significant amount of M. pneumoniae specific IgM antibody detected. Performed At: [facility] [place], [place] [id number] Lindon Romp MD [id number]   Legionella Pneumophila Serogp 1 Ur Ag     Status: None   Collection Time: 11/25/14  4:18 PM  Result Value Ref Range   L. pneumophila Serogp 1 Ur Ag Negative Negative    Comment: (NOTE) Performed At: [facility] [address] [place], [place] [id number] Lindon Romp MD [id number]    Source of Sample URINE, RANDOM   Culture, expectorated sputum-assessment     Status: None   Collection Time: 11/25/14  4:19 PM  Result Value Ref Range   Specimen Description EXPECTORATED SPUTUM    Special Requests Normal    Sputum evaluation THIS SPECIMEN IS ACCEPTABLE FOR SPUTUM CULTURE    Report Status 11/25/2014 FINAL   Culture, respiratory (NON-Expectorated)     Status: None   Collection Time: 11/25/14  4:19 PM  Result Value Ref Range   Specimen Description  EXPECTORATED SPUTUM    Special Requests Normal Reflexed from [id number]    Gram Stain      FEW WBC SEEN MODERATE GRAM POSITIVE COCCI IN CLUSTERS IN PAIRS FEW GRAM NEGATIVE RODS    Culture APPEARS TO BE NORMAL FLORA    Report Status 11/27/2014 FINAL   CBC     Status: Abnormal   Collection Time: 11/26/14  6:38 AM  Result Value  Ref Range   WBC 5.2 3.6 - 11.0 K/uL   RBC 3.71 (L) 3.80 - 5.20 MIL/uL   Hemoglobin 11.0 (L) 12.0 - 16.0 g/dL   HCT 33.1 (L) 35.0 - 47.0 %   MCV 89.4 80.0 - 100.0 fL   MCH 29.8 26.0 - 34.0 pg   MCHC 33.3 32.0 - 36.0 g/dL   RDW 13.6 11.5 - 14.5 %   Platelets 142 (L) 150 - 440 K/uL  Basic metabolic panel     Status: Abnormal   Collection Time: 11/26/14  6:38 AM  Result Value Ref Range   Sodium 137 135 - 145 mmol/L   Potassium 3.5 3.5 - 5.1 mmol/L   Chloride 111 101 - 111 mmol/L   CO2 20 (L) 22 - 32 mmol/L   Glucose, Bld 104 (H) 65 - 99 mg/dL   BUN <5 (L) 6 - 20 mg/dL   Creatinine, Ser 0.31 (L) 0.44 - 1.00 mg/dL   Calcium 7.6 (L) 8.9 - 10.3 mg/dL   GFR calc non Af Amer >60 >60 mL/min   GFR calc Af Amer >60 >60 mL/min    Comment: (NOTE) The eGFR has been calculated using the CKD EPI equation. This calculation has not been validated in all clinical situations. eGFR's persistently <60 mL/min signify possible Chronic Kidney Disease.    Anion gap 6 5 - 15   I  Have reviewed vital signs and labs above and reviewed with Dr. Ouida Sills   Discharge Instructions:  Discharged Condition: stable, improved cough, afebrile, improved breathing status, labs improving   Activity: unrestricted  Diet: routine and Regular  Medications:    Medication List    STOP taking these medications        guaiFENesin 600 MG 12 hr tablet  Commonly known as:  MUCINEX      TAKE these medications        acetaminophen 325 MG tablet  Commonly known as:  TYLENOL  Take 2 tablets (650 mg total) by mouth every 4 (four) hours as needed for mild pain, fever or headache.     amoxicillin-clavulanate 500-125 MG tablet  Commonly known as:  AUGMENTIN  Take 1 tablet (500 mg total) by mouth 3 (three) times daily.     guaiFENesin-dextromethorphan 100-10 MG/5ML syrup  Commonly known as:  ROBITUSSIN DM  Take 10 mLs by mouth every 6 (six) hours as needed for cough.     metroNIDAZOLE 500 MG tablet  Commonly  known as:  FLAGYL  Take 1 tablet (500 mg total) by mouth 2 (two) times daily.     multivitamin tablet  Take 1 tablet by mouth daily.        Discharge Instructions: Preterm/ labor signs reviewed                                            Fetal kick counts  Take Flagyl as prescribed for Bacterial Vaginosis diagnosed last week           Augmentin 555m TID x 5 days          Robitussin DM OTC 168m every 6 hours for cough Discharge Instructions    Discharge activity:  No Restrictions    Complete by:  As directed      Discharge diet:  No restrictions    Complete by:  As directed      Discharge instructions    Complete by:  As directed   Call if worsening s/s Take Augmentin prescription Begin Robitussin DM as directed Take Flagyl as prescribed for bacterial vaginosis [facility]     Fetal Kick Count:  Lie on our left side for one hour after a meal, and count the number of times your baby kicks.  If it is less than 5 times, get up, move around and drink some juice.  Repeat the test 30 minutes later.  If it is still less than 5 kicks in an hour, notify your doctor.    Complete by:  As directed      LABOR:  When conractions begin, you should start to time them from the beginning of one contraction to the beginning  of the next.  When contractions are 5 - 10 minutes apart or less and have been regular for at least an hour, you should call your health care provider.    Complete by:  As directed      No sexual activity restrictions    Complete by:  As directed      Notify physician for a general feeling that "something is not right"    Complete by:  As directed      Notify physician for bleeding from the vagina    Complete by:  As directed      Notify physician for blurring of vision or spots before the eyes    Complete by:  As directed      Notify physician for chills or fever    Complete by:  As directed      Notify physician  for fainting spells, "black outs" or loss of consciousness    Complete by:  As directed      Notify physician for increase in vaginal discharge    Complete by:  As directed      Notify physician for increase or change in vaginal discharge    Complete by:  As directed      Notify physician for intestinal cramps, with or without diarrhea, sometimes described as "gas pain"    Complete by:  As directed      Notify physician for leaking of fluid    Complete by:  As directed      Notify physician for leaking of fluid    Complete by:  As directed      Notify physician for low, dull backache, unrelieved by heat or Tylenol    Complete by:  As directed      Notify physician for menstrual like cramps    Complete by:  As directed      Notify physician for pain or burning when urinating    Complete by:  As directed      Notify physician for pelvic pressure (sudden increase)    Complete by:  As directed      Notify physician for pelvic pressure    Complete by:  As directed  Notify physician for severe or continued nausea or vomiting    Complete by:  As directed      Notify physician for sudden gushing of fluid from the vagina (with or without continued leaking)    Complete by:  As directed      Notify physician for sudden, constant, or occasional abdominal pain    Complete by:  As directed      Notify physician for uterine contractions.  These may be painless and feel like the uterus is tightening or the baby is  "balling up"    Complete by:  As directed      Notify physician for vaginal bleeding    Complete by:  As directed      Notify physician if baby moving less than usual    Complete by:  As directed      PRETERM LABOR:  Includes any of the follwing symptoms that occur between 20 - [redacted] weeks gestation.  If these symptoms are not stopped, preterm labor can result in preterm delivery, placing your baby at risk    Complete by:  As directed            Discharge to: Home  Follow up :       Follow-up Information    Follow up with Laverta Baltimore, MD. Go on 12/03/2014.   Specialty:  Obstetrics and Gynecology   Why:  at 1030 am for follow-up   Contact information:   [place] [id number] [phone]    After follow-up with [facility], resume care at [facility] Department  Dr. Ouida Sills is aware/ agrees with above plan   Signed:  Darliss Cheney, CNM

## 2014-11-30 LAB — CULTURE, BLOOD (ROUTINE X 2)
Culture: NO GROWTH
Culture: NO GROWTH

## 2015-01-21 LAB — OB RESULTS CONSOLE GBS: GBS: POSITIVE

## 2015-02-11 ENCOUNTER — Other Ambulatory Visit: Payer: Self-pay | Admitting: Advanced Practice Midwife

## 2015-02-12 ENCOUNTER — Inpatient Hospital Stay
Admission: EM | Admit: 2015-02-12 | Discharge: 2015-02-14 | DRG: 775 | Disposition: A | Discharge status: Home / Self Care | Payer: Managed Care, Other (non HMO) | Attending: Obstetrics and Gynecology | Admitting: Obstetrics and Gynecology

## 2015-02-12 ENCOUNTER — Encounter: Payer: Self-pay | Admitting: *Deleted

## 2015-02-12 ENCOUNTER — Ambulatory Visit: Payer: MEDICAID

## 2015-02-12 DIAGNOSIS — O99820 Streptococcus B carrier state complicating pregnancy: Secondary | ICD-10-CM | POA: Diagnosis present

## 2015-02-12 DIAGNOSIS — O99824 Streptococcus B carrier state complicating childbirth: Secondary | ICD-10-CM | POA: Diagnosis present

## 2015-02-12 DIAGNOSIS — Z3A39 39 weeks gestation of pregnancy: Secondary | ICD-10-CM | POA: Diagnosis not present

## 2015-02-12 DIAGNOSIS — Z3493 Encounter for supervision of normal pregnancy, unspecified, third trimester: Secondary | ICD-10-CM

## 2015-02-12 LAB — CBC
HCT: 40.3 % (ref 35.0–47.0)
Hemoglobin: 13.1 g/dL (ref 12.0–16.0)
MCH: 29.1 pg (ref 26.0–34.0)
MCHC: 32.5 g/dL (ref 32.0–36.0)
MCV: 89.6 fL (ref 80.0–100.0)
Platelets: 205 10*3/uL (ref 150–440)
RBC: 4.5 MIL/uL (ref 3.80–5.20)
RDW: 14 % (ref 11.5–14.5)
WBC: 12.3 10*3/uL — ABNORMAL HIGH (ref 3.6–11.0)

## 2015-02-12 LAB — TYPE AND SCREEN
ABO/RH(D): O POS
Antibody Screen: NEGATIVE

## 2015-02-12 MED ORDER — OXYTOCIN 40 UNITS IN LACTATED RINGERS INFUSION - SIMPLE MED
62.5000 mL/h | INTRAVENOUS | Status: DC
  Administered 2015-02-12: 999 [IU] via INTRAVENOUS

## 2015-02-12 MED ORDER — LANOLIN HYDROUS EX OINT: TOPICAL_OINTMENT | CUTANEOUS | Status: DC | PRN

## 2015-02-12 MED ORDER — PRENATAL MULTIVITAMIN CH
1.0000 | ORAL_TABLET | Freq: Every day | ORAL | Status: DC
  Administered 2015-02-13: 1 via ORAL
  Filled 2015-02-12 (×2): qty 1

## 2015-02-12 MED ORDER — BENZOCAINE-MENTHOL 20-0.5 % EX AERO: 1.0000 "application " | INHALATION_SPRAY | CUTANEOUS | Status: DC | PRN

## 2015-02-12 MED ORDER — SENNOSIDES-DOCUSATE SODIUM 8.6-50 MG PO TABS
2.0000 | ORAL_TABLET | ORAL | Status: DC
  Administered 2015-02-12 – 2015-02-13 (×2): 2 via ORAL
  Filled 2015-02-12 (×2): qty 2

## 2015-02-12 MED ORDER — MISOPROSTOL 200 MCG PO TABS
ORAL_TABLET | ORAL | Status: AC
  Filled 2015-02-12: qty 4

## 2015-02-12 MED ORDER — LACTATED RINGERS IV SOLN
INTRAVENOUS | Status: DC
  Administered 2015-02-12: 13:00:00 via INTRAVENOUS

## 2015-02-12 MED ORDER — MAGNESIUM HYDROXIDE 400 MG/5ML PO SUSP: 30.0000 mL | ORAL | Status: DC | PRN

## 2015-02-12 MED ORDER — DIBUCAINE 1 % RE OINT: 1.0000 "application " | TOPICAL_OINTMENT | RECTAL | Status: DC | PRN

## 2015-02-12 MED ORDER — OXYTOCIN 40 UNITS IN LACTATED RINGERS INFUSION - SIMPLE MED
INTRAVENOUS | Status: AC
  Administered 2015-02-12: 999 [IU] via INTRAVENOUS
  Filled 2015-02-12: qty 1000

## 2015-02-12 MED ORDER — ZOLPIDEM TARTRATE 5 MG PO TABS: 5.0000 mg | ORAL_TABLET | Freq: Every evening | ORAL | Status: DC | PRN

## 2015-02-12 MED ORDER — LACTATED RINGERS IV SOLN
500.0000 mL | INTRAVENOUS | Status: DC | PRN
Start: 2015-02-12 — End: 2015-02-12

## 2015-02-12 MED ORDER — ONDANSETRON HCL 4 MG PO TABS: 4.0000 mg | ORAL_TABLET | ORAL | Status: DC | PRN

## 2015-02-12 MED ORDER — MEASLES, MUMPS & RUBELLA VAC ~~LOC~~ INJ: 0.5000 mL | INJECTION | Freq: Once | SUBCUTANEOUS | Status: DC

## 2015-02-12 MED ORDER — AMMONIA AROMATIC IN INHA
RESPIRATORY_TRACT | Status: AC
  Filled 2015-02-12: qty 10

## 2015-02-12 MED ORDER — OXYTOCIN BOLUS FROM INFUSION: 500.0000 mL | INTRAVENOUS | Status: DC

## 2015-02-12 MED ORDER — LIDOCAINE HCL (PF) 1 % IJ SOLN
30.0000 mL | INTRAMUSCULAR | Status: DC | PRN
  Filled 2015-02-12: qty 30

## 2015-02-12 MED ORDER — ACETAMINOPHEN 325 MG PO TABS
650.0000 mg | ORAL_TABLET | ORAL | Status: DC | PRN
  Administered 2015-02-12 – 2015-02-13 (×2): 650 mg via ORAL
  Filled 2015-02-12 (×2): qty 2

## 2015-02-12 MED ORDER — SIMETHICONE 80 MG PO CHEW: 80.0000 mg | CHEWABLE_TABLET | ORAL | Status: DC | PRN

## 2015-02-12 MED ORDER — FERROUS SULFATE 325 (65 FE) MG PO TABS
325.0000 mg | ORAL_TABLET | Freq: Two times a day (BID) | ORAL | Status: DC
  Administered 2015-02-13 – 2015-02-14 (×3): 325 mg via ORAL
  Filled 2015-02-12 (×3): qty 1

## 2015-02-12 MED ORDER — BUTORPHANOL TARTRATE 1 MG/ML IJ SOLN: 1.0000 mg | INTRAMUSCULAR | Status: DC | PRN

## 2015-02-12 MED ORDER — WITCH HAZEL-GLYCERIN EX PADS: 1.0000 "application " | MEDICATED_PAD | CUTANEOUS | Status: DC | PRN

## 2015-02-12 MED ORDER — DIPHENHYDRAMINE HCL 25 MG PO CAPS: 25.0000 mg | ORAL_CAPSULE | Freq: Four times a day (QID) | ORAL | Status: DC | PRN

## 2015-02-12 MED ORDER — SODIUM CHLORIDE 0.9 % IV SOLN
INTRAVENOUS | Status: AC
  Administered 2015-02-12: 13:00:00
  Filled 2015-02-12: qty 2000

## 2015-02-12 MED ORDER — ONDANSETRON HCL 4 MG/2ML IJ SOLN: 4.0000 mg | INTRAMUSCULAR | Status: DC | PRN

## 2015-02-12 MED ORDER — CITRIC ACID-SODIUM CITRATE 334-500 MG/5ML PO SOLN: 30.0000 mL | ORAL | Status: DC | PRN

## 2015-02-12 MED ORDER — ACETAMINOPHEN 325 MG PO TABS: 650.0000 mg | ORAL_TABLET | ORAL | Status: DC | PRN

## 2015-02-12 MED ORDER — OXYTOCIN 10 UNIT/ML IJ SOLN
INTRAMUSCULAR | Status: AC
  Filled 2015-02-12: qty 2

## 2015-02-12 MED ORDER — OXYTOCIN 40 UNITS IN LACTATED RINGERS INFUSION - SIMPLE MED
62.5000 mL/h | INTRAVENOUS | Status: DC | PRN
  Filled 2015-02-12: qty 1000

## 2015-02-12 MED ORDER — IBUPROFEN 600 MG PO TABS
600.0000 mg | ORAL_TABLET | Freq: Four times a day (QID) | ORAL | Status: DC
  Administered 2015-02-12 – 2015-02-14 (×7): 600 mg via ORAL
  Filled 2015-02-12 (×8): qty 1

## 2015-02-12 MED ORDER — ONDANSETRON HCL 4 MG/2ML IJ SOLN: 4.0000 mg | Freq: Four times a day (QID) | INTRAMUSCULAR | Status: DC | PRN

## 2015-02-12 NOTE — H&P (Signed)
Kimberly Mccoy is a 24 y.o. female presenting for active labor . G4P2 at 39+0 weeks based on 13 weeks U/S. No LOF , No Vag bleeding /. Pregnancy complications : mycoplasma pneumonia  Treated 9 / 2016 History OB History    Gravida Para Term Preterm AB TAB SAB Ectopic Multiple Living   3 2 2  0 0 0 0 0 1 4     Past Medical History  Diagnosis Date  . Medical history non-contributory   [facility] No past surgical history on file. Family History: family history is not on file. Social History:  reports that she has never smoked. She has never used smokeless tobacco. She reports that she does not drink alcohol or use illicit drugs.   Prenatal Transfer Tool  Maternal Diabetes: No Genetic Screening: Normal Maternal Ultrasounds/Referrals: Normal Fetal Ultrasounds or other Referrals:  None Maternal Substance Abuse:  No Significant Maternal Medications:  None Significant Maternal Lab Results:  None Other Comments:  None  ROS    There were no vitals taken for this visit. Exam Physical Exam  Prenatal labs: ABO, Rh: --/--/O POS, O POS (09/25 0255) Antibody: NEG (09/25 0255) Rubella:  immune RPR:   NR HBsAg: Negative (06/16 0000)  HIV: Non-reactive (06/15 0000)  varicella : Immune  GBS:   +  Assessment/Plan: Active labor , clear ROM  Reassuring fetal monitoring GBS status   + will start 2 gm Ampicillin IV   SCHERMERHORN,THOMAS 02/12/2015, 12:48 PM

## 2015-02-12 NOTE — Lactation Note (Signed)
This note was copied from the chart of Kimberly Mccoy. Lactation Consultation Note  Patient Name: Kimberly Mccoy ZOXWR'UToday's Date: 02/12/2015 Reason for consult: Initial assessment   Maternal Data Formula Feeding for Exclusion: No  Feeding Feeding Type: Breast Fed  LATCH Score/Interventions    Audible Swallowing: Spontaneous and intermittent  Type of Nipple: Everted at rest and after stimulation  Comfort (Breast/Nipple): Soft / non-tender     Hold (Positioning): Assistance needed to correctly position infant at breast and maintain latch. Intervention(s): Breastfeeding basics reviewed;Support Pillows;Position options;Skin to skin     Interpreter used for this consult     Consult Status Consult Status: PRN Follow-up type: In-patient    Trudee GripCarolyn P Masayoshi Couzens 02/12/2015, 8:21 PM

## 2015-02-12 NOTE — Progress Notes (Signed)
Patient ID: Kimberly Mccoy, female   DOB: 03/28/1990, 24 y.o.   MRN: [id number] Baby moving both arms well

## 2015-02-13 LAB — CBC
HCT: 36.6 % (ref 35.0–47.0)
Hemoglobin: 11.7 g/dL — ABNORMAL LOW (ref 12.0–16.0)
MCH: 29.1 pg (ref 26.0–34.0)
MCHC: 32 g/dL (ref 32.0–36.0)
MCV: 90.8 fL (ref 80.0–100.0)
Platelets: 185 10*3/uL (ref 150–440)
RBC: 4.03 MIL/uL (ref 3.80–5.20)
RDW: 14.2 % (ref 11.5–14.5)
WBC: 10.9 10*3/uL (ref 3.6–11.0)

## 2015-02-13 LAB — CHLAMYDIA/NGC RT PCR (ARMC ONLY)
Chlamydia Tr: NOT DETECTED
N gonorrhoeae: NOT DETECTED

## 2015-02-13 LAB — RPR: RPR Ser Ql: NONREACTIVE

## 2015-02-13 NOTE — Progress Notes (Signed)
PPD #1 SVD, baby boy   S:  Reports feeling good, but sore             Tolerating po/ No nausea or vomiting             Bleeding is light             Pain controlled with Motrin and Tylenol              Up ad lib / ambulatory / voiding QS  Newborn breast feeding with formula supplementation   O:               VS: BP 109/71 mmHg  Pulse 75  Temp(Src) 98.7 F (37.1 C) (Oral)  Resp 18  Ht 5\' 4"  (1.626 m)  Wt 90.266 kg (199 lb)  BMI 34.14 kg/m2  SpO2 97%  LMP  (Approximate)  Breastfeeding? Unknown   LABS:              Recent Labs  02/12/15 1255 02/13/15 0637  WBC 12.3* 10.9  HGB 13.1 11.7*  PLT 205 185               Blood type: --/--/O POS (12/15 1255)  Rubella:     Immune  Varicella: Immune                   I&O: Intake/Output      12/15 0701 - 12/16 0700 12/16 0701 - 12/17 0700   I.V. (mL/kg) 1406.2 (15.6)    Total Intake(mL/kg) 1406.2 (15.6)    Urine (mL/kg/hr) 600    Total Output 600     Net +806.2                        Physical Exam:             Alert and oriented X3  Lungs: Clear and unlabored  Heart: regular rate and rhythm / no mumurs  Abdomen: soft, non-tender, non-distended              Fundus: firm, non-tender, U-1  Perineum: intact, no significant edema, no significant erythema or odor  Lochia: light  Extremities: mild LE dependent edema, no calf pain or tenderness    A: PPD # 1   Doing well - stable status  Hx. Of PNA Mycoplasma in pregnancy - treated and resolved   P: Routine post partum orders  See Lactation today  Pt. Consider BTL, but has not signed Medicaid papers - will discuss with TJS today / if she cannot get a BTL, she would like the Nexplanon   Ambulate and shower today   Anticipate discharge home tomorrow  Carlean JewsMeredith Sigmon, CNM

## 2015-02-14 MED ORDER — IBUPROFEN 600 MG PO TABS: 600.0000 mg | ORAL_TABLET | Freq: Four times a day (QID) | ORAL | Status: AC

## 2015-02-14 MED ORDER — OXYCODONE-ACETAMINOPHEN 5-325 MG PO TABS: 1.0000 | ORAL_TABLET | Freq: Four times a day (QID) | ORAL | Status: AC | PRN

## 2015-02-14 NOTE — Discharge Instructions (Signed)
Postpartum Care After Vaginal Delivery °After you deliver your newborn (postpartum period), the usual stay in the hospital is 24-72 hours. If there were problems with your labor or delivery, or if you have other medical problems, you might be in the hospital longer.  °While you are in the hospital, you will receive help and instructions on how to care for yourself and your newborn during the postpartum period.  °While you are in the hospital: °· Be sure to tell your nurses if you have pain or discomfort, as well as where you feel the pain and what makes the pain worse. °· If you had an incision made near your vagina (episiotomy) or if you had some tearing during delivery, the nurses may put ice packs on your episiotomy or tear. The ice packs may help to reduce the pain and swelling. °· If you are breastfeeding, you may feel uncomfortable contractions of your uterus for a couple of weeks. This is normal. The contractions help your uterus get back to normal size. °· It is normal to have some bleeding after delivery. °¨ For the first 1-3 days after delivery, the flow is red and the amount may be similar to a period. °¨ It is common for the flow to start and stop. °¨ In the first few days, you may pass some small clots. Let your nurses know if you begin to pass large clots or your flow increases. °¨ Do not  flush blood clots down the toilet before having the nurse look at them. °¨ During the next 3-10 days after delivery, your flow should become more watery and pink or brown-tinged in color. °¨ Ten to fourteen days after delivery, your flow should be a small amount of yellowish-white discharge. °¨ The amount of your flow will decrease over the first few weeks after delivery. Your flow may stop in 6-8 weeks. Most women have had their flow stop by 12 weeks after delivery. °· You should change your sanitary pads frequently. °· Wash your hands thoroughly with soap and water for at least 20 seconds after changing pads, using  the toilet, or before holding or feeding your newborn. °· You should feel like you need to empty your bladder within the first 6-8 hours after delivery. °· In case you become weak, lightheaded, or faint, call your nurse before you get out of bed for the first time and before you take a shower for the first time. °· Within the first few days after delivery, your breasts may begin to feel tender and full. This is called engorgement. Breast tenderness usually goes away within 48-72 hours after engorgement occurs. You may also notice milk leaking from your breasts. If you are not breastfeeding, do not stimulate your breasts. Breast stimulation can make your breasts produce more milk. °· Spending as much time as possible with your newborn is very important. During this time, you and your newborn can feel close and get to know each other. Having your newborn stay in your room (rooming in) will help to strengthen the bond with your newborn.  It will give you time to get to know your newborn and become comfortable caring for your newborn. °· Your hormones change after delivery. Sometimes the hormone changes can temporarily cause you to feel sad or tearful. These feelings should not last more than a few days. If these feelings last longer than that, you should talk to your caregiver. °· If desired, talk to your caregiver about methods of family planning or contraception. °·   Talk to your caregiver about immunizations. Your caregiver may want you to have the following immunizations before leaving the hospital:  Tetanus, diphtheria, and pertussis (Tdap) or tetanus and diphtheria (Td) immunization. It is very important that you and your family (including grandparents) or others caring for your newborn are up-to-date with the Tdap or Td immunizations. The Tdap or Td immunization can help protect your newborn from getting ill.  Rubella immunization.  Varicella (chickenpox) immunization.  Influenza immunization. You should  receive this annual immunization if you did not receive the immunization during your pregnancy.   This information is not intended to replace advice given to you by your health care provider. Make sure you discuss any questions you have with your health care provider.   Document Released: 12/12/2006 Document Revised: 11/09/2011 Document Reviewed: 10/12/2011 Elsevier Interactive Patient Education 2016 Elsevier Inc. Cuidados en el postparto luego de un parto vaginal  (Postpartum Care After Vaginal Delivery) Despus del parto (perodo de postparto), la estada normal en el hospital es de 24-72 horas. Si hubo problemas con el trabajo de parto o el parto, o si tiene otros problemas mdicos, es posible que Hydrologist en el hospital por ms [place].  Mientras est en el hospital, recibir [place] e instrucciones sobre cmo cuidar de usted misma y de su beb recin nacido durante el postparto.  Mientras est en el hospital:   Asegrese de decirle a las enfermeras si siente dolor o Dentist, as como donde Medical laboratory scientific officer y Training and development officer.  Si usted tuvo una incisin cerca de la vagina (episiotoma) o si ha tenido Armed forces technical officer, las enfermeras le pondrn hielo sobre la episiotoma o Art therapist. Las bolsas de hielo pueden ayudar a Glass blower/designer y la hinchazn.  Si est amamantando, puede sentir contracciones dolorosas en el tero durante algunas semanas. Esto es normal. Las contracciones ayudan a que el tero vuelva a su tamao normal.  Es normal tener algo de sangrado despus del [place].  Durante los primeros 1-3 das despus del parto, el flujo es de color rojo y la cantidad puede ser similar a un perodo.  Es frecuente que el flujo se inicie y se Chief Strategy Officer.  En los primeros [place], puede eliminar algunos cogulos pequeos. Informe a las enfermeras si elimina cogulos grandes o aumenta el flujo.  No  elimine los cogulos de sangre por el inodoro antes de que la [employer] vea.  Durante los prximos 3 a [address] despus del parto, el flujo debe ser ms acuoso y rosado o Child psychotherapist.  [place] a catorce [employer] del parto, el flujo debe ser una pequea cantidad de secrecin de color blanco amarillento.  La cantidad de flujo disminuir en las primeras semanas despus del parto. El flujo puede detenerse en 6-8 semanas. La mayora de las mujeres no tienen ms flujo a las 12 semanas despus del [place].  Usted debe cambiar sus apsitos con frecuencia.  Lvese bien las manos con agua y jabn durante al menos 20 segundos despus de cambiar el apsito, usar el bao o antes de Occupational psychologist o Corporate treasurer a su recin nacido.  Usted podr sentir como que tiene que vaciar la vejiga durante las primeras 6-8 horas despus del [place].  En caso de que sienta debilidad, mareo o [place], llame a la enfermera antes de levantarse de la cama por primera vez y antes de tomar una ducha por primera vez.  [place]ent[place] de los [address] despus del parto, sus [place]  pueden comenzar a estar sensibles y llenas. Esto se llama congestin. La sensibilidad en los senos por lo general desaparece dentro de las 48-72 horas despus de que ocurre la congestin. Tambin puede notar que la [place] se escapa de sus senos. Si no est amamantando no estimule sus pechos. La estimulacin de las mamas hace que sus senos produzcan ms [place].  Pasar tanto tiempo como le sea posible con el beb recin nacido es muy importante. Durante ese tiempo, usted y su beb deben sentirse cerca y conocerse uno al otro. Tener al beb en su habitacin (alojamiento conjunto) ayudar a fortalecer el vnculo con el beb recin nacido.Esto le dar tiempo para conocerlo y atenderlo de [place] cmoda.  Las hormonas se modifican despus del parto. A veces, los cambios hormonales pueden causar tristeza o ganas de llorar por un tiempo. Estos sentimientos no deben durar ms de [employer]. Si duran ms que eso, debe hablar con su mdico.  Si lo  desea, hable con su mdico acerca de los mtodos de planificacin familiar o mtodos anticonceptivos.  Hable con su mdico acerca de las vacunas. El mdico puede indicarle que se aplique las siguientes vacunas antes de salir del hospital:  [place] contra el ttanos, la difteria y la tos ferina (Tdap) o el ttanos y la difteria (Td). Es muy importante que usted y su familia (incluyendo a los abuelos) u otras personas que cuidan al recin nacido estn al da con las vacunas Tdap o Td. Las vacunas Tdap o Td pueden ayudar a proteger al recin nacido de enfermedades.  Inmunizacin contra la rubola.  Inmunizacin contra la varicela.  Inmunizacin contra la gripe. Usted debe recibir esta vacunacin anual si no la ha recibido Academic librarian.   Esta informacin no tiene Theme park manager el consejo del mdico. Asegrese de hacerle al mdico cualquier pregunta que tenga.   Document Released: 12/12/2006 Document Revised: 11/09/2011 Elsevier Interactive Patient Education [employer]  Care After Vaginal Delivery Congratulations on your new baby!!  Refer to this sheet in the next few weeks. These discharge instructions provide you with information on caring for yourself after delivery. Your caregiver may also give you specific instructions. Your treatment has been planned according to the most current medical practices available, but problems sometimes occur. Call your caregiver if you have any problems or questions after you go home.  HOME CARE INSTRUCTIONS  Take over-the-counter or prescription medicines only as directed by your caregiver or pharmacist.  Do not drink alcohol, especially if you are breastfeeding or taking medicine to relieve pain.  Do not chew or smoke tobacco.  Do not use illegal drugs.  Continue to use good perineal care. Good perineal care includes:  Wiping your perineum from front to back.  Keeping your perineum clean.  Do not use tampons or douche until  your caregiver says it is okay.  Shower, wash your hair, and take tub baths as directed by your caregiver.  Wear a well-fitting bra that provides breast support.  Eat healthy foods.  Drink enough fluids to keep your urine clear or pale yellow.  Eat high-fiber foods such as whole grain cereals and breads, brown rice, beans, and fresh fruits and vegetables every day. These foods may help prevent or relieve constipation.  Follow your caregiver's recommendations regarding resumption of activities such as climbing stairs, driving, lifting, exercising, or traveling. Specifically, no driving for two weeks, so that you are comfortable reacting quickly in an emergency.  Talk to your caregiver  about resuming sexual activities. Resumption of sexual activities is dependent upon your risk of infection, your rate of healing, and your comfort and desire to resume sexual activity. Usually we recommend waiting about six weeks, or until your bleeding stops and you are interested in sex.  Try to have someone help you with your household activities and your newborn for at least a few days after you leave the hospital. Even longer is better.  Rest as much as possible. Try to rest or take a nap when your newborn is sleeping. Sleep deprivation can be very hard after delivery.  Increase your activities gradually.  Keep all of your scheduled postpartum appointments. It is very important to keep your scheduled follow-up appointments. At these appointments, your caregiver will be checking to make sure that you are healing physically and emotionally.  SEEK MEDICAL CARE IF:   You are passing large clots from your vagina.   You have a foul smelling discharge from your vagina.  You have trouble urinating.  You are urinating frequently.  You have pain when you urinate.  You have a change in your bowel movements.  You have increasing redness, pain, or swelling near your vaginal incision (episiotomy) or vaginal  tear.  You have pus draining from your episiotomy or vaginal tear.  Your episiotomy or vaginal tear is separating.  You have painful, hard, or reddened breasts.  You have a severe headache.  You have blurred vision or see spots.  You feel sad or depressed.  You have thoughts of hurting yourself or your newborn.  You have questions about your care, the care of your newborn, or medicines.  You are dizzy or light-headed.  You have a rash.  You have nausea or vomiting.  You were breastfeeding and have not had a menstrual period within 12 weeks after you stopped breastfeeding.  You are not breastfeeding and have not had a menstrual period by the 12th week after delivery.  You have a fever.  SEEK IMMEDIATE MEDICAL CARE IF:   You have persistent pain.  You have chest pain.  You have shortness of breath.  You faint.  You have leg pain.  You have stomach pain.  Your vaginal bleeding saturates two or more sanitary pads in 1 hour.  MAKE SURE YOU:   Understand these instructions.  Will get help right away if you are not doing well or get worse.   Document Released: 02/12/2000 Document Revised: 07/01/2013 Document Reviewed: 10/12/2011  [facility] Patient Information 2015 [place], [place]. This information is not intended to replace advice given to you by your health care provider. Make sure you discuss any questions you have with your health care provider. Ibuprofen tablets and capsules What is this medicine? IBUPROFEN (eye BYOO proe fen) is a non-steroidal anti-inflammatory drug (NSAID). It is used for dental pain, fever, headaches or migraines, osteoarthritis, rheumatoid arthritis, or painful monthly periods. It can also relieve minor aches and pains caused by a cold, flu, or sore throat. This medicine may be used for other purposes; ask your health care provider or pharmacist if you have questions. What should I tell my health care provider before I take this  medicine? They need to know if you have any of these conditions: -asthma -cigarette smoker -drink more than 3 alcohol containing drinks a day -heart disease or circulation problems such as heart failure or leg edema (fluid retention) -high blood pressure -kidney disease -liver disease -stomach bleeding or ulcers -an unusual or allergic reaction to ibuprofen,  aspirin, other NSAIDS, other medicines, foods, dyes, or preservatives -pregnant or trying to get pregnant -breast-feeding How should I use this medicine? Take this medicine by mouth with a glass of water. Follow the directions on the prescription label. Take this medicine with food if your stomach gets upset. Try to not lie down for at least 10 minutes after you take the medicine. Take your medicine at regular intervals. Do not take your medicine more often than directed. A special MedGuide will be given to you by the pharmacist with each prescription and refill. Be sure to read this information carefully each time. Talk to your pediatrician regarding the use of this medicine in children. Special care may be needed. Overdosage: If you think you have taken too much of this medicine contact a poison control center or emergency room at once. NOTE: This medicine is only for you. Do not share this medicine with others. What if I miss a dose? If you miss a dose, take it as soon as you can. If it is almost time for your next dose, take only that dose. Do not take double or extra doses. What may interact with this medicine? Do not take this medicine with any of the following medications: -cidofovir -ketorolac -methotrexate -pemetrexed This medicine may also interact with the following medications: -alcohol -aspirin -diuretics -lithium -other drugs for inflammation like prednisone -warfarin This list may not describe all possible interactions. Give your health care provider a list of all the medicines, herbs, non-prescription drugs, or  dietary supplements you use. Also tell them if you smoke, drink alcohol, or use illegal drugs. Some items may interact with your medicine. What should I watch for while using this medicine? Tell your doctor or healthcare professional if your symptoms do not start to get better or if they get worse. This medicine does not prevent heart attack or stroke. In fact, this medicine may increase the chance of a heart attack or stroke. The chance may increase with longer use of this medicine and in people who have heart disease. If you take aspirin to prevent heart attack or stroke, talk with your doctor or health care professional. Do not take other medicines that contain aspirin, ibuprofen, or naproxen with this medicine. Side effects such as stomach upset, nausea, or ulcers may be more likely to occur. Many medicines available without a prescription should not be taken with this medicine. This medicine can cause ulcers and bleeding in the stomach and intestines at any time during treatment. Ulcers and bleeding can happen without warning symptoms and can cause death. To reduce your risk, do not smoke cigarettes or drink alcohol while you are taking this medicine. You may get drowsy or dizzy. Do not drive, use machinery, or do anything that needs mental alertness until you know how this medicine affects you. Do not stand or sit up quickly, especially if you are an older patient. This reduces the risk of dizzy or fainting spells. This medicine can cause you to bleed more easily. Try to avoid damage to your teeth and gums when you brush or floss your teeth. This medicine may be used to treat migraines. If you take migraine medicines for 10 or more days a month, your migraines may get worse. Keep a diary of headache days and medicine use. Contact your healthcare professional if your migraine attacks occur more frequently. What side effects may I notice from receiving this medicine? Side effects that you should report  to your doctor or health care  professional as soon as possible: -allergic reactions like skin rash, itching or hives, swelling of the face, lips, or tongue -severe stomach pain -signs and symptoms of bleeding such as bloody or black, tarry stools; red or dark-brown urine; spitting up blood or brown material that looks like coffee grounds; red spots on the skin; unusual bruising or bleeding from the eye, gums, or nose -signs and symptoms of a blood clot such as changes in vision; chest pain; severe, sudden headache; trouble speaking; sudden numbness or weakness of the face, arm, or leg -unexplained weight gain or swelling -unusually weak or tired -yellowing of eyes or skin Side effects that usually do not require medical attention (report to your doctor or health care professional if they continue or are bothersome): -bruising -diarrhea -dizziness, drowsiness -headache -nausea, vomiting This list may not describe all possible side effects. Call your doctor for medical advice about side effects. You may report side effects to FDA at 1-800-FDA-1088. Where should I keep my medicine? Keep out of the reach of children. Store at room temperature between 15 and 30 degrees C (59 and 86 degrees F). Keep container tightly closed. Throw away any unused medicine after the expiration date. NOTE: This sheet is a summary. It may not cover all possible information. If you have questions about this medicine, talk to your doctor, pharmacist, or health care provider.    2016, Elsevier/Gold Standard. (2012-10-16 10:48:02)  Acetaminophen; Oxycodone tablets What is this medicine? ACETAMINOPHEN; OXYCODONE (a set a MEE noe fen; ox i KOE done) is a pain reliever. It is used to treat moderate to severe pain. This medicine may be used for other purposes; ask your health care provider or pharmacist if you have questions. What should I tell my health care provider before I take this medicine? They need to know if you  have any of these conditions: -brain tumor -Crohn's disease, inflammatory bowel disease, or ulcerative colitis -drug abuse or addiction -head injury -heart or circulation problems -if you often drink alcohol -kidney disease or problems going to the bathroom -liver disease -lung disease, asthma, or breathing problems -an unusual or allergic reaction to acetaminophen, oxycodone, other opioid analgesics, other medicines, foods, dyes, or preservatives -pregnant or trying to get pregnant -breast-feeding How should I use this medicine? Take this medicine by mouth with a full glass of water. Follow the directions on the prescription label. You can take it with or without food. If it upsets your stomach, take it with food. Take your medicine at regular intervals. Do not take it more often than directed. Talk to your pediatrician regarding the use of this medicine in children. Special care may be needed. Patients over 48 years old may have a stronger reaction and need a smaller dose. Overdosage: If you think you have taken too much of this medicine contact a poison control center or emergency room at once. NOTE: This medicine is only for you. Do not share this medicine with others. What if I miss a dose? If you miss a dose, take it as soon as you can. If it is almost time for your next dose, take only that dose. Do not take double or extra doses. What may interact with this medicine? -alcohol -antihistamines -barbiturates like amobarbital, butalbital, butabarbital, methohexital, pentobarbital, phenobarbital, thiopental, and secobarbital -benztropine -drugs for bladder problems like solifenacin, trospium, oxybutynin, tolterodine, hyoscyamine, and methscopolamine -drugs for breathing problems like ipratropium and tiotropium -drugs for certain stomach or intestine problems like propantheline, homatropine methylbromide, glycopyrrolate, atropine, belladonna,  and dicyclomine -general anesthetics like  etomidate, ketamine, nitrous oxide, propofol, desflurane, enflurane, halothane, isoflurane, and sevoflurane -medicines for depression, anxiety, or psychotic disturbances -medicines for sleep -muscle relaxants -naltrexone -narcotic medicines (opiates) for pain -phenothiazines like perphenazine, thioridazine, chlorpromazine, mesoridazine, fluphenazine, prochlorperazine, promazine, and trifluoperazine -scopolamine -tramadol -trihexyphenidyl This list may not describe all possible interactions. Give your health care provider a list of all the medicines, herbs, non-prescription drugs, or dietary supplements you use. Also tell them if you smoke, drink alcohol, or use illegal drugs. Some items may interact with your medicine. What should I watch for while using this medicine? Tell your doctor or health care professional if your pain does not go away, if it gets worse, or if you have new or a different type of pain. You may develop tolerance to the medicine. Tolerance means that you will need a higher dose of the medication for pain relief. Tolerance is normal and is expected if you take this medicine for a long time. Do not suddenly stop taking your medicine because you may develop a severe reaction. Your body becomes used to the medicine. This does NOT mean you are addicted. Addiction is a behavior related to getting and using a drug for a non-medical reason. If you have pain, you have a medical reason to take pain medicine. Your doctor will tell you how much medicine to take. If your doctor wants you to stop the medicine, the dose will be slowly lowered over time to avoid any side effects. You may get drowsy or dizzy. Do not drive, use machinery, or do anything that needs mental alertness until you know how this medicine affects you. Do not stand or sit up quickly, especially if you are an older patient. This reduces the risk of dizzy or fainting spells. Alcohol may interfere with the effect of this  medicine. Avoid alcoholic drinks. There are different types of narcotic medicines (opiates) for pain. If you take more than one type at the same time, you may have more side effects. Give your health care provider a list of all medicines you use. Your doctor will tell you how much medicine to take. Do not take more medicine than directed. Call emergency for help if you have problems breathing. The medicine will cause constipation. Try to have a bowel movement at least every 2 to 3 days. If you do not have a bowel movement for 3 days, call your doctor or health care professional. Do not take Tylenol (acetaminophen) or medicines that have acetaminophen with this medicine. Too much acetaminophen can be very dangerous. Many nonprescription medicines contain acetaminophen. Always read the labels carefully to avoid taking more acetaminophen. What side effects may I notice from receiving this medicine? Side effects that you should report to your doctor or health care professional as soon as possible: -allergic reactions like skin rash, itching or hives, swelling of the face, lips, or tongue -breathing difficulties, wheezing -confusion -light headedness or fainting spells -severe stomach pain -unusually weak or tired -yellowing of the skin or the whites of the eyes Side effects that usually do not require medical attention (report to your doctor or health care professional if they continue or are bothersome): -dizziness -drowsiness -nausea -vomiting This list may not describe all possible side effects. Call your doctor for medical advice about side effects. You may report side effects to FDA at 1-800-FDA-1088. Where should I keep my medicine? Keep out of the reach of children. This medicine can be abused. Keep your medicine in  a safe place to protect it from theft. Do not share this medicine with anyone. Selling or giving away this medicine is dangerous and against the law. This medicine may cause  accidental overdose and death if it taken by other adults, children, or pets. Mix any unused medicine with a substance like cat litter or coffee grounds. Then throw the medicine away in a sealed container like a sealed bag or a coffee can with a lid. Do not use the medicine after the expiration date. Store at room temperature between 20 and 25 degrees C (68 and 77 degrees F). NOTE: This sheet is a summary. It may not cover all possible information. If you have questions about this medicine, talk to your doctor, pharmacist, or health care provider.    2016, Elsevier/Gold Standard. (2014-01-15 15:18:46)

## 2015-02-14 NOTE — Progress Notes (Signed)
PPD #2, SVD, baby boy "Melanee Spryan"  S:  Reports feeling good and ready to go home             Tolerating po/ No nausea or vomiting             Bleeding is light             Pain controlled with Motrin and Tylenol - pt. Reports she has intense cramping with breastfeeding and states the Motrin doesn't always help and she would like to try something stronger             Up ad lib / ambulatory / voiding QS  Newborn breast and formula feeding   O:               VS: BP 125/80 mmHg  Pulse 80  Temp(Src) 98 F (36.7 C) (Oral)  Resp 18  Ht 5\' 4"  (1.626 m)  Wt 90.266 kg (199 lb)  BMI 34.14 kg/m2  SpO2 99%  LMP  (Approximate)  Breastfeeding? Unknown   LABS:              Recent Labs  02/12/15 1255 02/13/15 0637  WBC 12.3* 10.9  HGB 13.1 11.7*  PLT 205 185               Blood type: --/--/O POS (12/15 1255)  Rubella:     Immune                  I&O: Intake/Output      12/16 0701 - 12/17 0700 12/17 0701 - 12/18 0700   P.O. 480    I.V. (mL/kg)     Total Intake(mL/kg) 480 (5.3)    Urine (mL/kg/hr)     Total Output       Net +480                        Physical Exam:             Alert and oriented X3  Lungs: Clear and unlabored  Heart: regular rate and rhythm / no mumurs  Abdomen: soft, non-tender, non-distended              Fundus: firm, non-tender, U-2  Perineum: intact, no significant erythema, minimal labial edema  Lochia: light, no clots  Extremities: non-pitting LE edema, no calf pain or tenderness    A: PPD # 2   Doing well - stable status  P: Routine post partum orders  D/C home   Discharge instructions and warning s/s reviewed  Continue taking Prenatal vitamin / May continue Colace and Motrin  Percocet 5-325mg  take 1 tablet every 6 hours PRN for severe pain Dispense: 15 tablets / No Refills   Pt. Still interested in getting a Postpartum tubal ligation - she is currently waiting to see if she has been approved for Medicaid - if she has been approved, she can sign the 30  day papers and have a pre-op visit with us for PP BTL / if she cannot get a tubal, she would like to get the Nexplanon at her 6 week visit at the [facility]  Carlean JewsMeredith Sigmon, CNM

## 2015-02-14 NOTE — Discharge Summary (Signed)
Obstetric Discharge Summary Reason for Admission: Active labor at 39 weeks  Prenatal Procedures: hx of bilateral mycoplasma pneumonia during third trimester - treated Intrapartum Procedures: spontaneous vaginal delivery / GBS positive - treated with Ampicillin  Postpartum Procedures: none Complications-Operative and Postpartum: none HEMOGLOBIN  Date Value Ref Range Status  02/13/2015 11.7* 12.0 - 16.0 g/dL Final  40/98/1191 47.8 g/dL Final   HGB  Date Value Ref Range Status  02/18/2013 11.7* 12.0-16.0 g/dL Final   HCT  Date Value Ref Range Status  02/13/2015 36.6 35.0 - 47.0 % Final  08/14/2014 39 % Final  02/17/2013 38.4 35.0-47.0 % Final    Physical Exam:  General: alert and cooperative Lochia: appropriate, no clots  Uterine Fundus: firm, U-2 Incision: N/A DVT Evaluation: No evidence of DVT seen on physical exam. No significant calf/ankle edema.  Discharge Diagnoses: Term Pregnancy-delivered  Discharge Information: Date: 02/14/2015 Activity: pelvic rest and no driving while taking the Percocet medication  Diet: routine Medications:    Medication List    STOP taking these medications        acetaminophen 325 MG tablet  Commonly known as:  TYLENOL     amoxicillin-clavulanate 500-125 MG tablet  Commonly known as:  AUGMENTIN     guaiFENesin-dextromethorphan 100-10 MG/5ML syrup  Commonly known as:  ROBITUSSIN DM     metroNIDAZOLE 500 MG tablet  Commonly known as:  FLAGYL      TAKE these medications        ibuprofen 600 MG tablet  Commonly known as:  ADVIL,MOTRIN  Take 1 tablet (600 mg total) by mouth every 6 (six) hours.     multivitamin tablet  Take 1 tablet by mouth daily.     oxyCODONE-acetaminophen 5-325 MG tablet  Commonly known as:  PERCOCET  Take 1 tablet by mouth every 6 (six) hours as needed for severe pain.       Condition: stable Instructions:  Discharge Instructions    Activity as tolerated    Complete by:  As directed      Call MD  for:  difficulty breathing, headache or visual disturbances    Complete by:  As directed      Call MD for:  extreme fatigue    Complete by:  As directed      Call MD for:  hives    Complete by:  As directed      Call MD for:  persistant dizziness or light-headedness    Complete by:  As directed      Call MD for:  persistant nausea and vomiting    Complete by:  As directed      Call MD for:  redness, tenderness, or signs of infection (pain, swelling, redness, odor or green/yellow discharge around incision site)    Complete by:  As directed      Call MD for:  severe uncontrolled pain    Complete by:  As directed      Call MD for:  temperature >100.4    Complete by:  As directed      Call MD for:    Complete by:  As directed   Call with worsening s/s of depression, if you feel unable to take care of yourself or your baby, if you develop suicidal or homicidal thoughts     Diet - low sodium heart healthy    Complete by:  As directed      Driving restriction     Complete by:  As directed   Avoid  driving while taking Percocet     Sexual acrtivity    Complete by:  As directed   No intercourse for 6 weeks          Discharge to: home Follow-up Information    [facility] up [facility]. Schedule an appointment as soon as possible for a visit in 6 weeks.   Why:  Pt. is requesting a postpartum tubal ligation if she is approved for Medicaid / [facility] will call her Monday to discuss further details    Contact information:   [address] [place] [id number] [phone]       Newborn Data: Live born female  Birth Weight: 8 lb 10.6 oz (3930 g) APGAR: 8, 9  Home with mother.  Pt. Still interested in getting a Postpartum tubal ligation - she is currently waiting to see if she has been approved for Medicaid - if she has been approved, she can sign the 30 day papers and have a pre-op visit with us for PP BTL / if she cannot get a  tubal, she would like to get the Nexplanon at her 6 week visit at the [facility] Karena AddisonSigmon, Hyman Crossan C 02/14/2015, 9:54 AM

## 2015-03-26 LAB — HM PAP SMEAR: HM Pap smear: NEGATIVE

## 2016-09-01 LAB — HM HIV SCREENING LAB: HM HIV Screening: NEGATIVE

## 2016-12-14 IMAGING — CR DG CHEST 2V
1 series · 2 of 2 positions shown · non-contrast
Comparison: 08/02/2011

CLINICAL DATA: Cough and fever.  Second trimester pregnancy.

EXAM:
CHEST  2 VIEW

[Series 1: w chest pa · 0.14mm/px · 2 of 2 slices shown]
[im 1/2]
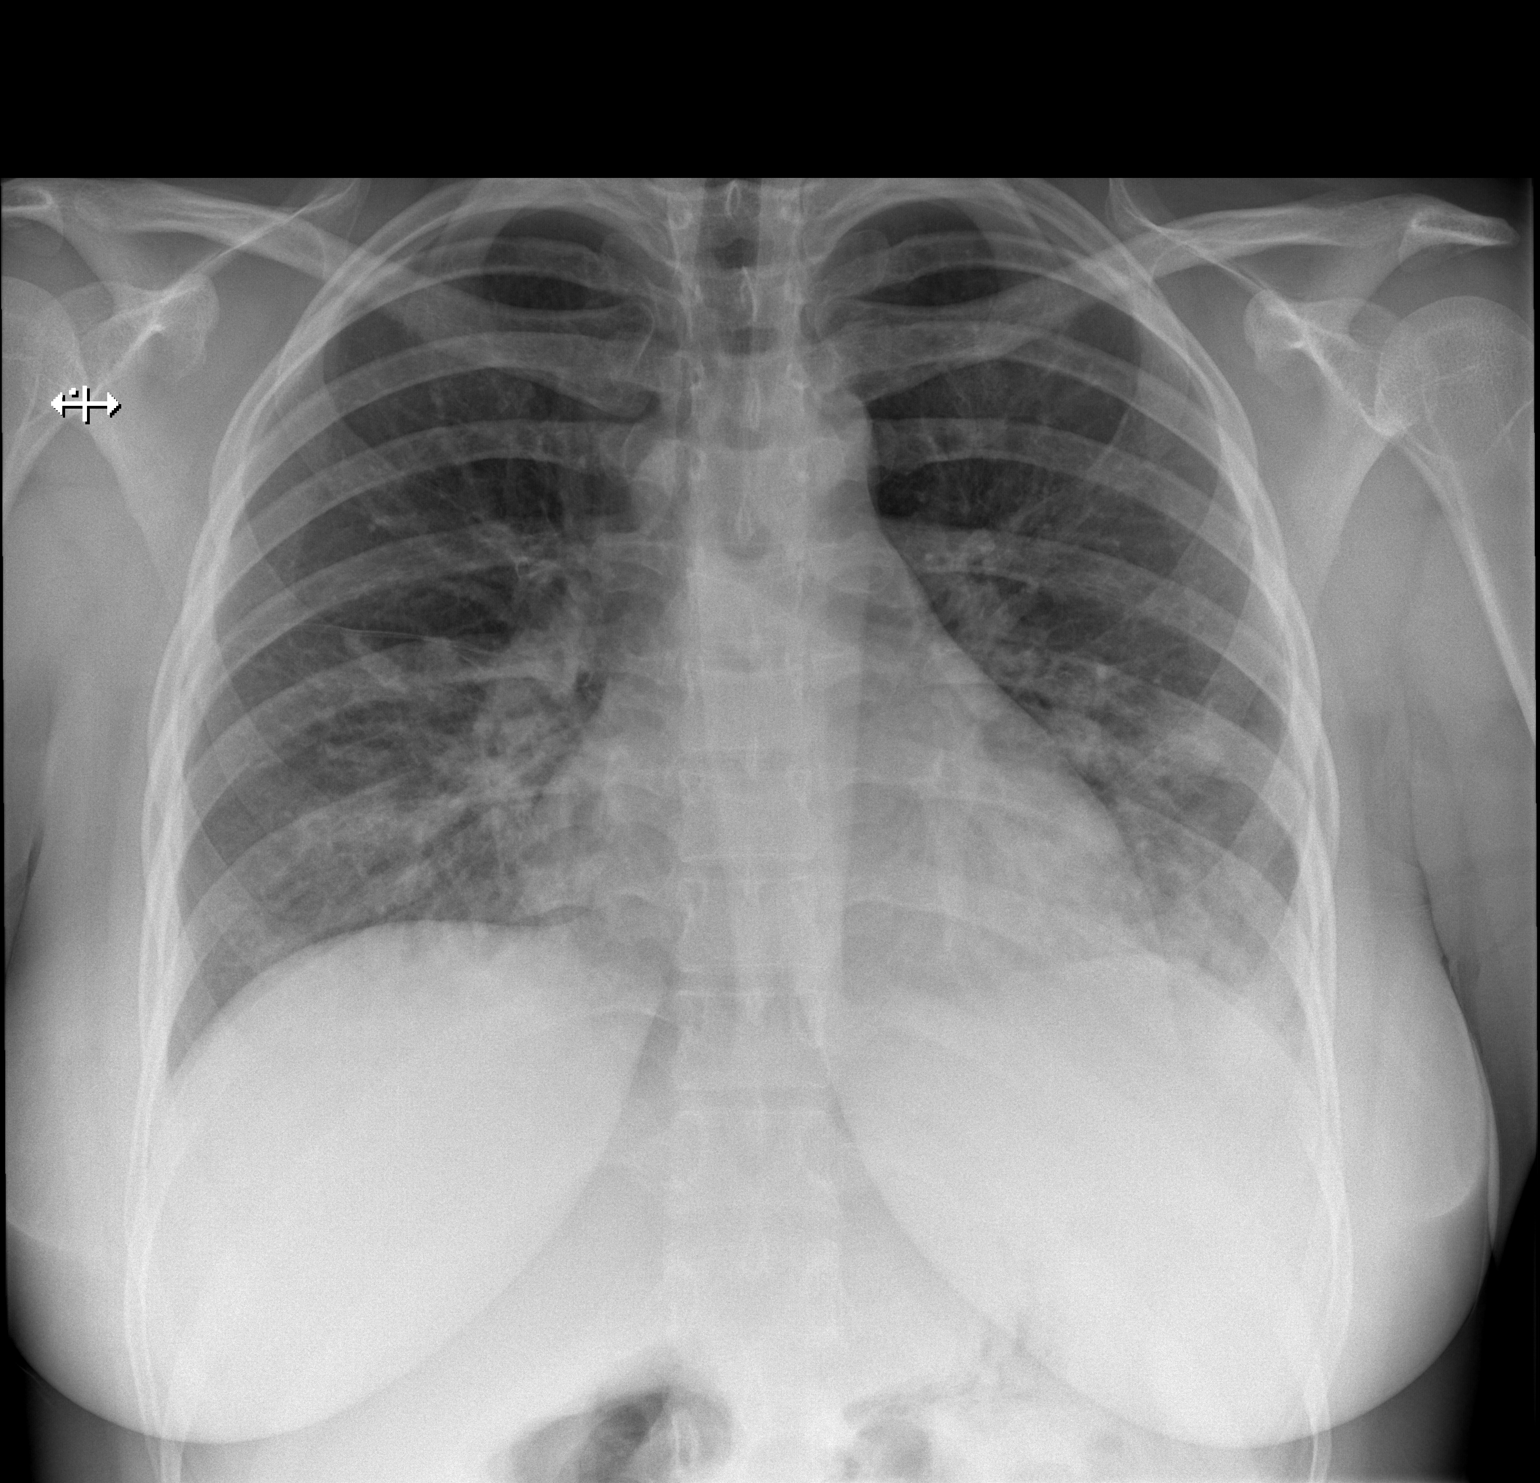
[im 2/2]
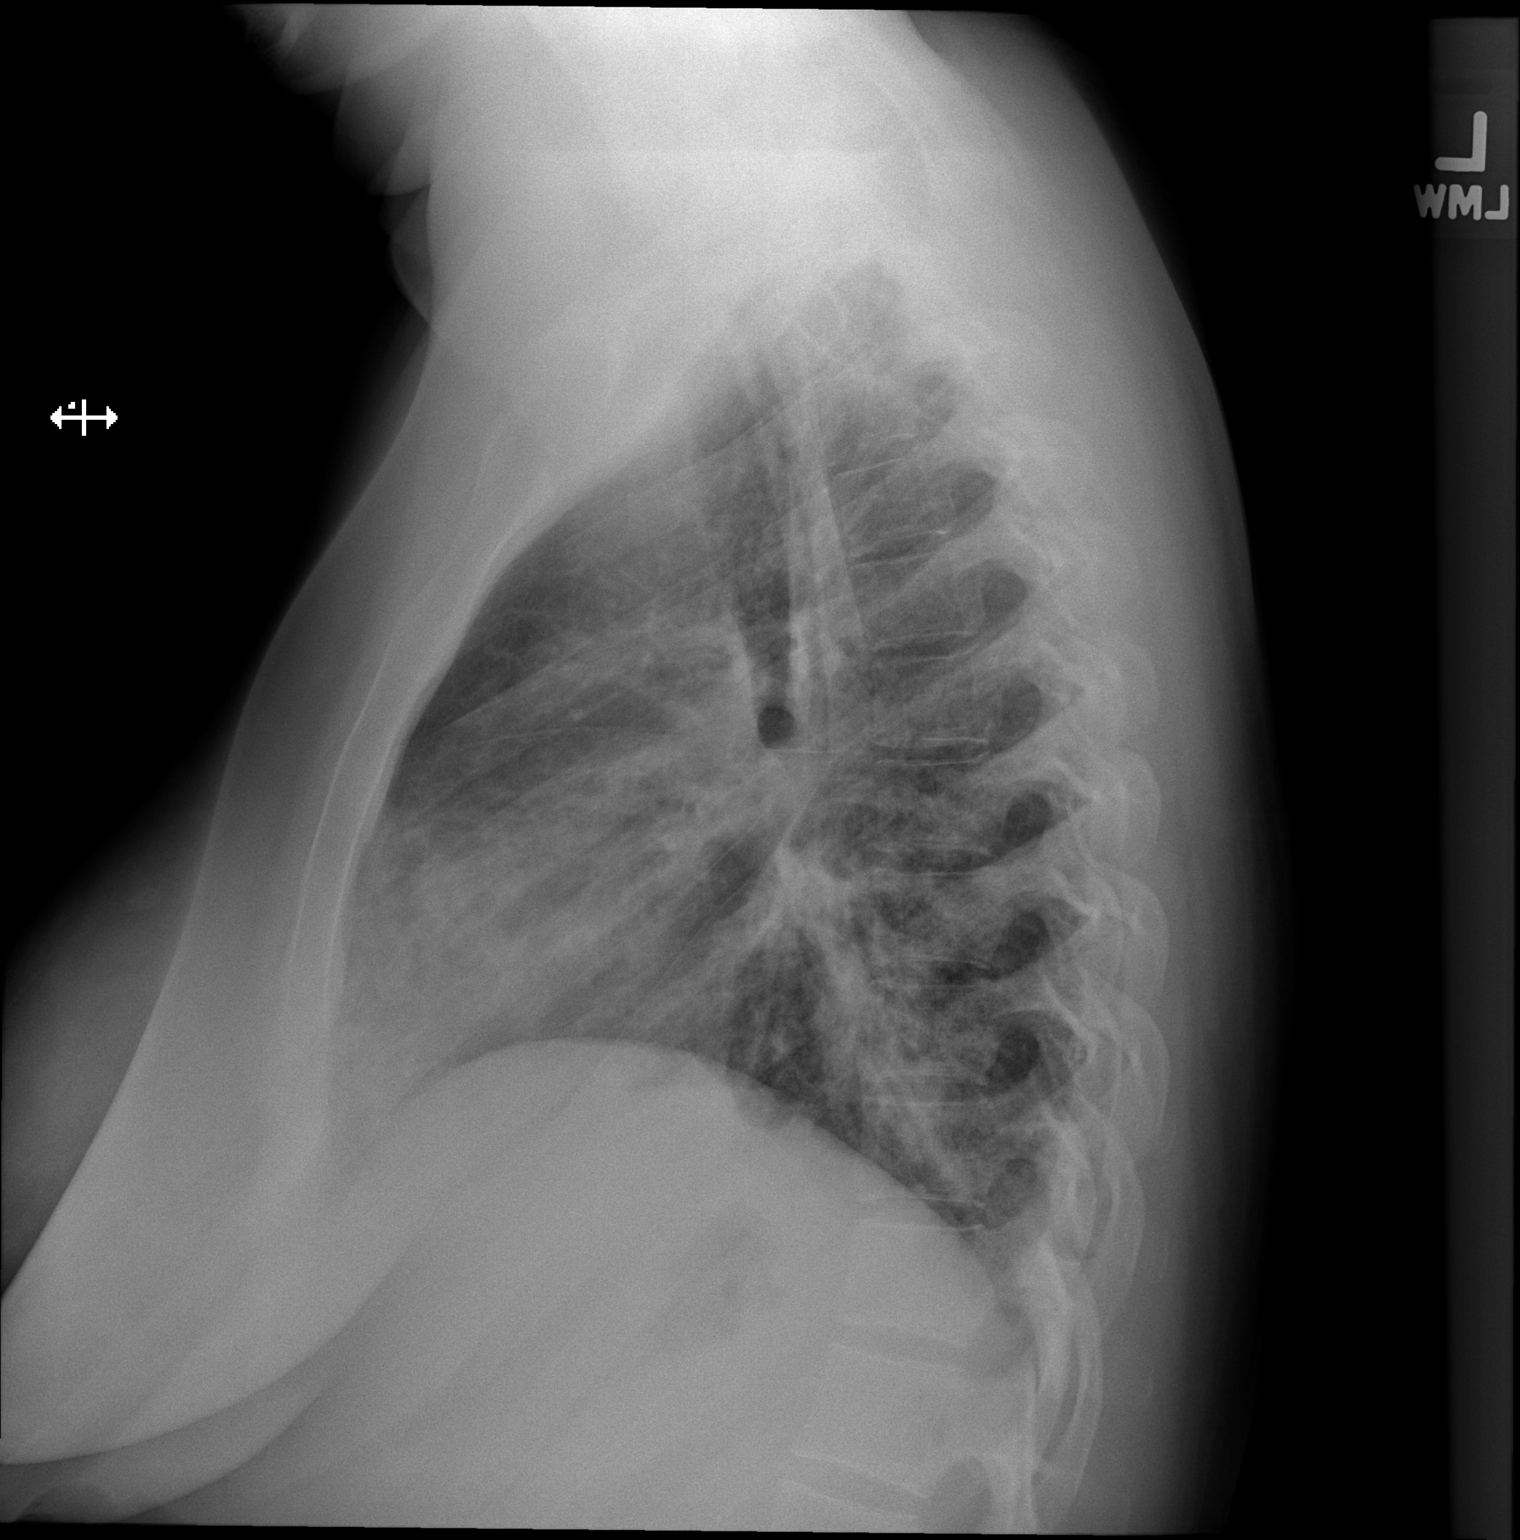

[2 of 2 positions shown; findings below may reference images not displayed]

FINDINGS: Bilateral airspace disease consistent with pneumonia. No cavitation
or pleural effusion. Normal heart size and mediastinal contours.
IMPRESSION: Bilateral pneumonia.

## 2019-09-12 ENCOUNTER — Telehealth: Payer: Self-pay | Admitting: Family Medicine

## 2019-09-12 NOTE — Telephone Encounter (Signed)
Patient wants physical, nexplanon removal and reinsertion

## 2019-09-12 NOTE — Telephone Encounter (Signed)
Phone call to pt. Unable to leave message, voicemail not set up. 

## 2019-09-13 NOTE — Telephone Encounter (Signed)
Phone call to pt. Voicemail no set up, unable to leave message.

## 2019-09-13 NOTE — Telephone Encounter (Signed)
Phone call to pt. Voicemail not set up, unable to leave message.

## 2019-09-16 ENCOUNTER — Telehealth: Payer: Self-pay | Admitting: Family Medicine

## 2019-09-16 NOTE — Telephone Encounter (Signed)
Phone call to pt with interpreter. Voicemail not set up, unable to leave message.

## 2019-09-18 ENCOUNTER — Telehealth: Payer: Self-pay

## 2019-09-18 NOTE — Telephone Encounter (Signed)
Roddie Mc Yemen assisted with call. Jossie Ng, RN

## 2019-09-18 NOTE — Telephone Encounter (Signed)
Call to client as RN inadvertently scheduled Family Planning appt incorrectly. Appr rescheduled for Friday 09/27/2019. Jossie Ng, RN

## 2019-09-18 NOTE — Telephone Encounter (Signed)
Client desires Nexplanon removal stating was placed in 2017 and unable to get removal appt in 2020 due to Covid. Due to work schedule, is off this Friday and requesting appt for that day. Appt scheduled. Client unsure if desires Nexplanon re-insertion and to discuss with provider at visit. Client asking if visit will be completed by 2:30 pm as needs to retrieve son from [facility]. Client informed could not guarantee when appt would be completed. Elected to keep that appt time. Jossie Ng, RN

## 2019-09-20 ENCOUNTER — Ambulatory Visit: Payer: Self-pay

## 2019-09-25 ENCOUNTER — Ambulatory Visit: Payer: Self-pay

## 2019-09-27 ENCOUNTER — Ambulatory Visit: Payer: Self-pay

## 2020-03-02 ENCOUNTER — Ambulatory Visit (LOCAL_COMMUNITY_HEALTH_CENTER): Payer: Self-pay | Admitting: Physician Assistant

## 2020-03-02 ENCOUNTER — Encounter: Payer: Self-pay | Admitting: Physician Assistant

## 2020-03-02 ENCOUNTER — Other Ambulatory Visit: Payer: Self-pay

## 2020-03-02 VITALS — BP 101/63 | Ht 63.0 in | Wt 174.0 lb

## 2020-03-02 DIAGNOSIS — Z113 Encounter for screening for infections with a predominantly sexual mode of transmission: Secondary | ICD-10-CM

## 2020-03-02 DIAGNOSIS — Z01419 Encounter for gynecological examination (general) (routine) without abnormal findings: Secondary | ICD-10-CM

## 2020-03-02 DIAGNOSIS — Z7189 Other specified counseling: Secondary | ICD-10-CM

## 2020-03-02 DIAGNOSIS — Z3009 Encounter for other general counseling and advice on contraception: Secondary | ICD-10-CM

## 2020-03-02 NOTE — Progress Notes (Signed)
[facility] [address] Main Number: [phone]    Family Planning Visit- Initial Visit  Subjective:  Kimberly Mccoy is a 30 y.o.  [id number]   being seen today for an initial well woman visit and to discuss family planning options.  She is currently using None for pregnancy prevention. Patient reports she does not want a pregnancy in the next year.  Patient has the following medical conditions has Pregnancy with abdominal pain of right upper quadrant, antepartum; Congestion of nasal sinus; Upper respiratory infection with cough and congestion; Bacterial vaginosis; Labor and delivery indication for care or intervention; Bilateral pneumonia; and Pregnant and not yet delivered in third trimester on their problem list.  Chief Complaint  Patient presents with  . Contraception    PE and Nexplanon removal/reinsertion    Patient reports that she is here for her physical and Nexplanon removal/reinsertion.  Reports that her Nexplanon was placed in February of 2017.  Per chart, last pap was in 2016, and last visit was 08/2016.  Patient will need CBE and pap today.  Patient reports a history of migraine headaches and with these has blurry vision and nausea.  Reports that occasionally she will have a sharp pain when she tries to take a deep breath, but that it usually resolves after a minute or 2.  States that she will occasionally have vaginal itching, but does not currently have any symptoms.  States that last week she had some pain in her right calf without redness, swelling or prolonged pain that it would come and go.  Reports she had changed her diet and lost a few pounds and plans to continue to try to eat more healthy.    Patient denies any other concerns.   Body mass index is 30.82 kg/m. - Patient is eligible for diabetes screening based on BMI and age >19?  not applicable HA1C ordered? not applicable  Patient reports 1  partner/s  in last year. Desires STI screening?  No - patient declines.  Has patient been screened once for HCV in the past?  No  No results found for: HCVAB  Does the patient have current drug use (including MJ), have a partner with drug use, and/or has been incarcerated since last result? No  If yes-- Screen for HCV through [facility]   Does the patient meet criteria for HBV testing? No  Criteria:  -Household, sexual or needle sharing contact with HBV -History of drug use -HIV positive -Those with known Hep C   Health Maintenance Due  Topic Date Due  . Hepatitis C Screening  Never done  . COVID-19 Vaccine (1) Never done  . TETANUS/TDAP  Never done  . PAP-Cervical Cytology Screening  03/25/2018  . PAP SMEAR-Modifier  03/25/2018  . INFLUENZA VACCINE  Never done    Review of Systems  All other systems reviewed and are negative.   The following portions of the patient's history were reviewed and updated as appropriate: allergies, current medications, past family history, past medical history, past social history, past surgical history and problem list. Problem list updated.   See flowsheet for other program required questions.  Objective:   Vitals:   03/02/20 0928  BP: 101/63  Weight: 174 lb (78.9 kg)  Height: 5\' 3"  (1.6 m)    Physical Exam Vitals and nursing note reviewed.  Constitutional:      General: She is not in acute distress.    Appearance: Normal appearance.  HENT:     Head: Normocephalic and atraumatic.     Mouth/Throat:     Mouth: Mucous membranes are moist.     Pharynx: Oropharynx is clear. No oropharyngeal exudate or posterior oropharyngeal erythema.  Eyes:     Conjunctiva/sclera: Conjunctivae normal.  Neck:     Thyroid: No thyroid mass, thyromegaly or thyroid tenderness.  Cardiovascular:     Rate and Rhythm: Normal rate and regular rhythm.  Pulmonary:     Effort: Pulmonary effort is normal.     Breath sounds: Normal breath sounds.  Chest:  Breasts:      Right: Normal. No mass, nipple discharge, skin change, tenderness, axillary adenopathy or supraclavicular adenopathy.     Left: Normal. No mass, nipple discharge, skin change, tenderness, axillary adenopathy or supraclavicular adenopathy.    Abdominal:     Palpations: Abdomen is soft. There is no mass.     Tenderness: There is no abdominal tenderness. There is no guarding or rebound.  Genitourinary:    General: Normal vulva.     Rectum: Normal.     Comments: External genitalia/pubic area without nits, lice, edema, erythema, lesions and inguinal adenopathy. Vagina with normal mucosa and discharge. Cervix without visible lesions. Uterus firm, mobile, nt, no masses, no CMT, no adnexal tenderness or fullness. Musculoskeletal:     Cervical back: Neck supple. No tenderness.  Lymphadenopathy:     Cervical: No cervical adenopathy.     Upper Body:     Right upper body: No supraclavicular, axillary or pectoral adenopathy.     Left upper body: No supraclavicular, axillary or pectoral adenopathy.  Skin:    General: Skin is warm and dry.     Findings: No bruising, erythema, lesion or rash.  Neurological:     Mental Status: She is alert and oriented to person, place, and time.  Psychiatric:        Mood and Affect: Mood normal.        Behavior: Behavior normal.        Thought Content: Thought content normal.        Judgment: Judgment normal.       Assessment and Plan:  Kimberly Mccoy is a 29 y.o. female presenting to the [facility] Department for an initial well woman exam/family planning visit  Contraception counseling: Reviewed all forms of birth control options in the tiered based approach. available including abstinence; over the counter/barrier methods; hormonal contraceptive medication including pill, patch, ring, injection,contraceptive implant, ECP; hormonal and nonhormonal IUDs; permanent sterilization options including vasectomy and the various tubal  sterilization modalities. Risks, benefits, and typical effectiveness rates were reviewed.  Questions were answered.  Written information was also given to the patient to review.  Patient desires a Nexplannon removal and reinsertion. She will follow up in  About 1 week for surveillance.  She was told to call with any further questions, or with any concerns about this method of contraception.  Emphasized use of condoms 100% of the time for STI prevention.  Patient was not a candidate for ECP today.   1. Encounter for counseling regarding contraception Counseled patient that her Nexplanon expired in February of 2021, so has not been protecting her from pregnancy. Counseled patient about the possibility of pregnancy due to unprotected sex 1 week ago and not eligible for ECP today. Enc patient to abstain and RTC in about 1 week for PT and if negative Nexplanon removal/reinsertion. Stressed to patient the importance of either abstaining or using condoms if she  has sex until 10 days after reinsertion.   2. Screening for STD (sexually transmitted disease) Await test results.  Counseled that RN will call if needs to RTC for treatment once results are back.  - HIV/HCV [facility] - Syphilis Serology, [facility]  3. Well woman exam with routine gynecological exam Reviewed with patient healthy habits to maintain general health. Enc MVI 1 po daily. Enc to establish with/ follow up with PCP for primary care concerns, age appropriate screenings and illness. Await pap results.  Counseled that RN will call or send a letter once results are back.  - IGP, rfx Aptima HPV ASCU     Return in about 1 week (around 03/09/2020) for PT, Nexplanon removal/reinsertion..  Future Appointments  Date Time Provider [place]  03/13/2020  4:00 PM [facility]FP PROVIDER [facility] None    Jerene Dilling, PA

## 2020-03-02 NOTE — Progress Notes (Signed)
See document from this same date for [facility] IP/RP visit.

## 2020-03-02 NOTE — Progress Notes (Signed)
Patient here for physical and nexplanon removal and reinsertion. Last pap 2017. Last sex about a week ago without condom per patient. Harvie Heck, RN   Post:  RN rescheduled patient for nexplanon removal and reinsertion for next Friday 03/13/2020. Patient instructed no sex without condom until appointment. Patient walked down to covid clinic for booster and to schedule appointment for children. Provider orders complete.

## 2020-03-03 LAB — IGP, RFX APTIMA HPV ASCU: PAP Smear Comment: 0

## 2020-03-08 ENCOUNTER — Encounter: Payer: Self-pay | Admitting: Student

## 2020-03-13 ENCOUNTER — Ambulatory Visit (LOCAL_COMMUNITY_HEALTH_CENTER): Payer: Self-pay | Admitting: Advanced Practice Midwife

## 2020-03-13 ENCOUNTER — Other Ambulatory Visit: Payer: Self-pay

## 2020-03-13 ENCOUNTER — Encounter: Payer: Self-pay | Admitting: Advanced Practice Midwife

## 2020-03-13 VITALS — BP 110/71 | Wt 172.2 lb

## 2020-03-13 DIAGNOSIS — Z30017 Encounter for initial prescription of implantable subdermal contraceptive: Secondary | ICD-10-CM

## 2020-03-13 DIAGNOSIS — Z3200 Encounter for pregnancy test, result unknown: Secondary | ICD-10-CM

## 2020-03-13 DIAGNOSIS — Z3046 Encounter for surveillance of implantable subdermal contraceptive: Secondary | ICD-10-CM

## 2020-03-13 DIAGNOSIS — G43909 Migraine, unspecified, not intractable, without status migrainosus: Secondary | ICD-10-CM

## 2020-03-13 DIAGNOSIS — Z3009 Encounter for other general counseling and advice on contraception: Secondary | ICD-10-CM

## 2020-03-13 DIAGNOSIS — E669 Obesity, unspecified: Secondary | ICD-10-CM

## 2020-03-13 LAB — PREGNANCY, URINE: Preg Test, Ur: NEGATIVE

## 2020-03-13 MED ORDER — ETONOGESTREL 68 MG ~~LOC~~ IMPL
68.0000 mg | DRUG_IMPLANT | Freq: Once | SUBCUTANEOUS | Status: AC
  Administered 2020-03-13: 68 mg via SUBCUTANEOUS

## 2020-03-13 NOTE — Progress Notes (Signed)
Patient returned for nexplanon removal and reinsertion. Physical on 03/02/2020. Last sex 3 weeks ago without condom. Last period 02/19/2020. RN sent patient to lab for pregnancy test per S.O.   Harvie Heck, RN   Post:  RN reviewed pregnancy test negative. Provider to insert nexplanon.  Harvie Heck, RN

## 2020-03-13 NOTE — Progress Notes (Signed)
Contraception/Family Planning VISIT ENCOUNTER NOTE  Subjective:   Kimberly Mccoy is a 30 y.o. SHF [id number] nonsmoker female here for reproductive life counseling.  Desires Nexplanon removal/reinsertion fom BCM.  Reports she does not want a pregnancy in the next year. Denies abnormal vaginal bleeding, discharge, pelvic pain, problems with intercourse or other gynecologic concerns. PT neg today.  Last sex 02/26/20 without condom; with current partner x 4 years; 1 partner in last 3 mo.  LMP 02/19/20.  Last PE and pap 03/02/20. Denies cigs, vaping, cigars.  Nexplanon inserted 04/2015   Gynecologic History Patient's last menstrual period was 02/19/2020. Contraception: none  Health Maintenance Due  Topic Date Due  . Hepatitis C Screening  Never done  . COVID-19 Vaccine (1) Never done  . TETANUS/TDAP  Never done  . PAP-Cervical Cytology Screening  03/25/2018  . INFLUENZA VACCINE  Never done     The following portions of the patient's history were reviewed and updated as appropriate: allergies, current medications, past family history, past medical history, past social history, past surgical history and problem list.  Review of Systems Pertinent items are noted in HPI.   Objective:  BP 110/71 (BP Location: Left Arm)   Wt 172 lb 3.2 oz (78.1 kg)   LMP 02/19/2020   BMI 30.50 kg/m  Gen: well appearing, NAD HEENT: no scleral icterus CV: RR Lung: Normal WOB Ext: warm well perfused   Assessment and Plan:   Contraception counseling: Reviewed all forms of birth control options in the tiered based approach. available including abstinence; over the counter/barrier methods; hormonal contraceptive medication including pill, patch, ring, injection,contraceptive implant, ECP; hormonal and nonhormonal IUDs; permanent sterilization options including vasectomy and the various tubal sterilization modalities. Risks, benefits, and typical effectiveness rates were reviewed.  Questions were answered.   Written information was also given to the patient to review.  Patient desires Nexplanon removal/reinsertion, this was prescribed for patient. She will follow up in 1 year for surveillance.  She was told to call with any further questions, or with any concerns about this method of contraception.  Emphasized use of condoms 100% of the time for STI prevention.  Patient was not offered ECP. ECP was not accepted by the patient. ECP counseling was not given - see RN documentation  1. Possible pregnancy, not confirmed Neg PT today - Pregnancy, urine  2. Family planning  - etonogestrel (NEXPLANON) implant 68 mg  3. Nexplanon removal Nexplanon Removal Patient identified, informed consent performed, consent signed.   Appropriate time out taken. Nexplanon site identified.  Area prepped in usual sterile fashon. 3 ml of 1% lidocaine with Epinephrine was used to anesthetize the area at the distal end of the implant and along implant site. A small stab incision was made right beside the implant on the distal portion.  The Nexplanon rod was grasped using straight hemostats/manual and removed without difficulty.  There was minimal blood loss. There were no complications.  Steri-strips were applied over the small incision.  A pressure bandage was applied to reduce any bruising.  The patient tolerated the procedure well and was given post procedure instructions.    4. Nexplanon insertion Nexplanon Insertion Procedure Patient identified, informed consent performed, consent signed.   Patient does understand that irregular bleeding is a very common side effect of this medication. She was advised to have backup contraception after placement. Patient was determined to meet WHO criteria for not being pregnant. Appropriate time out taken.  The insertion site was identified 8-10 cm (3-4  inches) from the medial epicondyle of the humerus and 3-5 cm (1.25-2 inches) posterior to (below) the sulcus (groove) between the biceps and  triceps muscles of the patient's left arm and marked.  Patient was prepped with alcohol swab and then injected with 3 ml of 1% lidocaine.  Arm was prepped with chlorhexidene, Nexplanon removed from packaging,  Device confirmed in needle, then inserted full length of needle and withdrawn per handbook instructions. Nexplanon was able to palpated in the patient's arm; patient palpated the insert herself. There was minimal blood loss.  Patient insertion site covered with guaze and a pressure bandage to reduce any bruising.  The patient tolerated the procedure well and was given post procedure instructions.  Nexplanon:   Counseled patient to take OTC analgesic starting as soon as lidocaine starts to wear off and take regularly for at least 48 hr to decrease discomfort.  Specifically to take with food or milk to decrease stomach upset and for IB 600 mg (3 tablets) every 6 hrs; IB 800 mg (4 tablets) every 8 hrs; or Aleve 2 tablets every 12 hrs.    5. Encounter for surveillance of implantable subdermal contraceptive   6. Migraine without status migrainosus, not intractable, unspecified migraine type   7. Obesity without serious comorbidity, unspecified classification, unspecified obesity type     Please refer to After Visit Summary for other counseling recommendations.   Return for PRN, yearly physical exam.  Alberteen Spindle, CNM [facility] DEPARTMENT
